# Patient Record
Sex: Female | Born: 1955
Health system: Southern US, Community
[De-identification: ages and names within clinical notes are randomized; demographics above are authoritative.]

## PROBLEM LIST (undated history)

## (undated) DIAGNOSIS — K5900 Constipation, unspecified: Secondary | ICD-10-CM

## (undated) DIAGNOSIS — K219 Gastro-esophageal reflux disease without esophagitis: Secondary | ICD-10-CM

## (undated) DIAGNOSIS — M5430 Sciatica, unspecified side: Secondary | ICD-10-CM

## (undated) DIAGNOSIS — I10 Essential (primary) hypertension: Secondary | ICD-10-CM

## (undated) HISTORY — PX: ROTATOR CUFF REPAIR: SHX139

---

## 2019-02-17 ENCOUNTER — Other Ambulatory Visit: Payer: Self-pay

## 2019-02-17 ENCOUNTER — Encounter (HOSPITAL_COMMUNITY): Payer: Self-pay | Admitting: Emergency Medicine

## 2019-02-17 ENCOUNTER — Ambulatory Visit (INDEPENDENT_AMBULATORY_CARE_PROVIDER_SITE_OTHER): Payer: PRIVATE HEALTH INSURANCE

## 2019-02-17 ENCOUNTER — Ambulatory Visit (HOSPITAL_COMMUNITY)
Admission: EM | Admit: 2019-02-17 | Discharge: 2019-02-17 | Disposition: A | Discharge status: Home / Self Care | Payer: PRIVATE HEALTH INSURANCE | Attending: Family Medicine | Admitting: Family Medicine

## 2019-02-17 DIAGNOSIS — M25562 Pain in left knee: Secondary | ICD-10-CM | POA: Diagnosis not present

## 2019-02-17 DIAGNOSIS — M1712 Unilateral primary osteoarthritis, left knee: Secondary | ICD-10-CM

## 2019-02-17 HISTORY — DX: Essential (primary) hypertension: I10

## 2019-02-17 NOTE — ED Triage Notes (Signed)
PT has left knee pain that started as she was bending to use the toilet. She felt a pop and a sharp pain and left knee "gave out."

## 2019-02-17 NOTE — Discharge Instructions (Addendum)
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 Follow-up with orthopedist when she gets back to her home in [place].

## 2019-02-17 NOTE — ED Provider Notes (Signed)
[place]    CSN: [id number] Arrival date & time: 02/17/19  1743      History   Chief Complaint Chief Complaint  Patient presents with  . Knee Pain    HPI Samantha Bright is a 63 y.o. female.   Patient was sitting on top of her today and felt a pop behind her left knee.  8 years ago she had arthroscopic surgery and was told then she had some degenerative changes.  She does complain that her knee makes noises with flexion extension and climbing steps.  There is no history of the knee locking or giving way until today.  HPI  Past Medical History:  Diagnosis Date  . Hypertension     There are no problems to display for this patient.   History reviewed. No pertinent surgical history.  OB History   No obstetric history on file.      Home Medications    Prior to Admission medications   Medication Sig Start Date End Date Taking? Authorizing Provider  cetirizine (ZYRTEC) 10 MG chewable tablet Chew 10 mg by mouth daily.   Yes [provider]  famotidine (PEPCID) 40 MG tablet Take 40 mg by mouth daily.   Yes [provider]  fluticasone (FLONASE) 50 MCG/ACT nasal spray Place into both nostrils daily.   Yes [provider]  nebivolol (BYSTOLIC) 10 MG tablet Take 10 mg by mouth daily.   Yes [provider]    Family History No family history on file.  Social History Social History   Tobacco Use  . Smoking status: Never Smoker  . Smokeless tobacco: Never Used  Substance Use Topics  . Alcohol use: Not on file  . Drug use: Not on file     Allergies   Patient has no allergy information on record.   Review of Systems Review of Systems  Musculoskeletal: Positive for arthralgias and joint swelling.  All other systems reviewed and are negative.    Physical Exam Triage Vital Signs ED Triage Vitals  Enc Vitals Group     BP 02/17/19 1837 (!) 150/76     Pulse Rate 02/17/19 1836 (!) 58     Resp 02/17/19 1836 16     Temp 02/17/19 1836 99.1 F (37.3 C)     Temp Source 02/17/19 1836 Oral     SpO2 02/17/19 1836 100 %     Weight --      Height --      Head Circumference --      Peak Flow --      Pain Score 02/17/19 1834 5     Pain Loc --      Pain Edu? --      Excl. in [place]? --    No data found.  Updated Vital Signs BP (!) 150/76   Pulse (!) 58   Temp 99.1 F (37.3 C) (Oral)   Resp 16   SpO2 100%   Visual Acuity Right Eye Distance:   Left Eye Distance:   Bilateral Distance:    Right Eye Near:   Left Eye Near:    Bilateral Near:     Physical Exam Constitutional:      Appearance: Normal appearance.  Musculoskeletal:     Comments: Left knee: There is a small effusion noted.  There is crepitance with flexion extension.  There is medial joint line tenderness although I cannot detect any instability. There is also tenderness posteriorly which raises suggestion possibility of plantaris rupture  or tear given her history.  Neurological:     Mental Status: She is alert.      UC Treatments / Results  Labs (all labs ordered are listed, but only abnormal results are displayed) Labs Reviewed - No data to display  EKG   Radiology No results found.  Procedures Procedures (including critical care time)  Medications Ordered in UC Medications - No data to display  Initial Impression / Assessment and Plan / UC Course  I have reviewed the triage vital signs and the nursing notes.  Pertinent labs & imaging results that were available during my care of the patient were reviewed by me and considered in my medical decision making (see chart for details).     Left knee pain.  Would favor degenerative tear of medial meniscus, but also consider plantaris rupture.  I have fitted her in a knee brace which she will wear until she sees her orthopedist in [place]. Final Clinical Impressions(s) / UC Diagnoses   Final diagnoses:  None   Discharge Instructions   None    ED Prescriptions      None     PDMP not reviewed this encounter.   Wardell Honour, MD 02/17/19 Des[place] Pole

## 2019-03-23 ENCOUNTER — Ambulatory Visit (HOSPITAL_COMMUNITY)
Admission: EM | Admit: 2019-03-23 | Discharge: 2019-03-23 | Disposition: A | Discharge status: Home / Self Care | Payer: Medicare Other | Attending: Family Medicine | Admitting: Family Medicine

## 2019-03-23 ENCOUNTER — Other Ambulatory Visit: Payer: Self-pay

## 2019-03-23 ENCOUNTER — Encounter (HOSPITAL_COMMUNITY): Payer: Self-pay

## 2019-03-23 DIAGNOSIS — Z8616 Personal history of COVID-19: Secondary | ICD-10-CM

## 2019-03-23 NOTE — ED Triage Notes (Signed)
Pt states COVID test positive on Jan 12 and denies any current sx. Needs clearance to fly back to [place].

## 2019-03-23 NOTE — ED Provider Notes (Signed)
[place]    CSN: [id number] Arrival date & time: 03/23/19  [place]      History   Chief Complaint Chief Complaint  Patient presents with  . covid clearance    HPI Samantha Bright is a 64 y.o. female.   Samantha Bright presents with requests for note or testing/ clearance to fly to [place] state s/p covid-19 infection. She tested positive 1/12 after her daughter tested positive, they live together. Her symptoms have since resolved. She feels well. No further cough. No fevers. She is scheduled to fly 2/7. State requires testing to arrive. She lives in [place] and had been here visiting.     ROS per HPI, negative if not otherwise mentioned.      Past Medical History:  Diagnosis Date  . Hypertension     There are no problems to display for this patient.   History reviewed. No pertinent surgical history.  OB History   No obstetric history on file.      Home Medications    Prior to Admission medications   Medication Sig Start Date End Date Taking? Authorizing Provider  cetirizine (ZYRTEC) 10 MG chewable tablet Chew 10 mg by mouth daily.    [provider]  famotidine (PEPCID) 40 MG tablet Take 40 mg by mouth daily.    [provider]  fluticasone (FLONASE) 50 MCG/ACT nasal spray Place into both nostrils daily.    [provider]  nebivolol (BYSTOLIC) 10 MG tablet Take 10 mg by mouth daily.    [provider]    Family History Family History  Family history unknown: Yes    Social History Social History   Tobacco Use  . Smoking status: Never Smoker  . Smokeless tobacco: Never Used  Substance Use Topics  . Alcohol use: Not on file  . Drug use: Not on file     Allergies   Penicillins   Review of Systems Review of Systems   Physical Exam Triage Vital Signs ED Triage Vitals  Enc Vitals Group     BP 03/23/19 1910 (!) 191/87     Pulse Rate 03/23/19 1910 70     Resp 03/23/19 1910 16     Temp  03/23/19 1910 98.5 F (36.9 C)     Temp Source 03/23/19 1910 Oral     SpO2 03/23/19 1910 100 %     Weight --      Height --      Head Circumference --      Peak Flow --      Pain Score 03/23/19 1930 0     Pain Loc --      Pain Edu? --      Excl. in [place]? --    No data found.  Updated Vital Signs BP (!) 191/87 (BP Location: Right Arm)   Pulse 70   Temp 98.5 F (36.9 C) (Oral)   Resp 16   SpO2 100%   Physical Exam Constitutional:      General: She is not in acute distress.    Appearance: She is well-developed.  Cardiovascular:     Rate and Rhythm: Normal rate.  Pulmonary:     Effort: Pulmonary effort is normal.  Skin:    General: Skin is warm and dry.  Neurological:     Mental Status: She is alert and oriented to person, place, and time.      UC Treatments / Results  Labs (all labs ordered are listed, but only  abnormal results are displayed) Labs Reviewed - No data to display  EKG   Radiology No results found.  Procedures Procedures (including critical care time)  Medications Ordered in UC Medications - No data to display  Initial Impression / Assessment and Plan / UC Course  I have reviewed the triage vital signs and the nursing notes.  Pertinent labs & imaging results that were available during my care of the patient were reviewed by me and considered in my medical decision making (see chart for details).     covid 19 infection recent, tested positive 1/12. No further symptoms. Per cdc guideline ok to end isolation. Note provided stating such, and ok to fly. Not recommended to retest at this time. Patient verbalized understanding and agreeable to plan.   Final Clinical Impressions(s) / UC Diagnoses   Final diagnoses:  History of COVID-19     Discharge Instructions     Per CDC guidelines ok to end isolation 10 days after onset of symptoms as long as symptoms have improved.    ED Prescriptions    None     PDMP not reviewed this encounter.    Zigmund Gottron, NP 03/23/19 1941

## 2019-03-23 NOTE — Discharge Instructions (Addendum)
Per CDC guidelines ok to end isolation 10 days after onset of symptoms as long as symptoms have improved.

## 2020-01-28 ENCOUNTER — Other Ambulatory Visit: Payer: Self-pay | Admitting: *Deleted

## 2020-01-28 DIAGNOSIS — R5381 Other malaise: Secondary | ICD-10-CM

## 2020-03-11 ENCOUNTER — Other Ambulatory Visit: Payer: Self-pay

## 2020-03-11 ENCOUNTER — Emergency Department (HOSPITAL_BASED_OUTPATIENT_CLINIC_OR_DEPARTMENT_OTHER)
Admission: EM | Admit: 2020-03-11 | Discharge: 2020-03-12 | Disposition: A | Discharge status: Home / Self Care | Payer: Medicare Other | Attending: Emergency Medicine | Admitting: Emergency Medicine

## 2020-03-11 ENCOUNTER — Encounter (HOSPITAL_BASED_OUTPATIENT_CLINIC_OR_DEPARTMENT_OTHER): Payer: Self-pay | Admitting: *Deleted

## 2020-03-11 DIAGNOSIS — K219 Gastro-esophageal reflux disease without esophagitis: Secondary | ICD-10-CM | POA: Insufficient documentation

## 2020-03-11 DIAGNOSIS — R109 Unspecified abdominal pain: Secondary | ICD-10-CM | POA: Insufficient documentation

## 2020-03-11 DIAGNOSIS — Z79899 Other long term (current) drug therapy: Secondary | ICD-10-CM | POA: Diagnosis not present

## 2020-03-11 DIAGNOSIS — I10 Essential (primary) hypertension: Secondary | ICD-10-CM | POA: Diagnosis not present

## 2020-03-11 HISTORY — DX: Constipation, unspecified: K59.00

## 2020-03-11 HISTORY — DX: Sciatica, unspecified side: M54.30

## 2020-03-11 HISTORY — DX: Gastro-esophageal reflux disease without esophagitis: K21.9

## 2020-03-11 MED ORDER — ONDANSETRON HCL 4 MG/2ML IJ SOLN
4.0000 mg | Freq: Once | INTRAMUSCULAR | Status: AC
  Administered 2020-03-11: 4 mg via INTRAVENOUS
  Filled 2020-03-11: qty 2

## 2020-03-11 MED ORDER — FENTANYL CITRATE (PF) 100 MCG/2ML IJ SOLN
100.0000 ug | Freq: Once | INTRAMUSCULAR | Status: AC
  Administered 2020-03-11: 100 ug via INTRAVENOUS
  Filled 2020-03-11: qty 2

## 2020-03-11 NOTE — ED Triage Notes (Addendum)
Left flank pain since this am. States she has been constipated.

## 2020-03-11 NOTE — ED Notes (Signed)
Left flank pain x 1 day. Patient has hx of kidney stones and constipation. Patient states good BM today.

## 2020-03-11 NOTE — ED Provider Notes (Signed)
[facility] Provider Note: Georgena Spurling, MD, FACEP  CSN: [id number] MRN: [id number] ARRIVAL: 03/11/20 at [place]: [place]  Flank Pain   HISTORY OF PRESENT ILLNESS  03/11/20 11:01 PM Samantha Bright is a 65 y.o. female who developed left flank pain during sleep yesterday evening.  On awakening this morning the pain was severe (10 out of 10), worse with movement.  She applied a heating pad and this improve the pain and up that she was able to get out of bed.  The pain is somewhat like previous sciatica but in a different location.  She is not aware of any injury or inciting event.  She has had no hematuria or dysuria.  She has had no nausea or vomiting with this.  She does have chronic constipation for which she takes MiraLAX.    Past Medical History:  Diagnosis Date  . Constipation   . GERD (gastroesophageal reflux disease)   . Hypertension   . Sciatica     Past Surgical History:  Procedure Laterality Date  . ROTATOR CUFF REPAIR      Family History  Family history unknown: Yes    Social History   Tobacco Use  . Smoking status: Never Smoker  . Smokeless tobacco: Never Used  Substance Use Topics  . Alcohol use: Not Currently  . Drug use: Never    Prior to Admission medications   Medication Sig Start Date End Date Taking? Authorizing Provider  cetirizine (ZYRTEC) 10 MG chewable tablet Chew 10 mg by mouth daily.   Yes [provider]  famotidine (PEPCID) 40 MG tablet Take 40 mg by mouth daily.   Yes [provider]  fluticasone (FLONASE) 50 MCG/ACT nasal spray Place into both nostrils daily.   Yes [provider]  LORazepam (ATIVAN) 1 MG tablet Take 1 tablet (1 mg total) by mouth every 8 (eight) hours as needed (muscle spasms). 03/12/20  Yes Kennadie Brenner, MD  nebivolol (BYSTOLIC) 10 MG tablet Take 10 mg by mouth daily.   Yes [provider]    Allergies Penicillins   REVIEW OF SYSTEMS   Negative except as noted here or in the History of Present Illness.   PHYSICAL EXAMINATION  Initial Vital Signs Blood pressure (!) 157/81, pulse 66, temperature 97.8 F (36.6 C), temperature source Oral, resp. rate 20, height 5' 7.5" (1.715 m), weight 72.6 kg, SpO2 100 %.  Examination General: Well-developed, well-nourished female in no acute distress; appearance consistent with age of record HENT: normocephalic; atraumatic Eyes: pupils equal, round and reactive to light; extraocular muscles intact; arcus senilis bilaterally Neck: supple Heart: regular rate and rhythm Lungs: clear to auscultation bilaterally Abdomen: soft; nondistended; nontender; bowel sounds present Back/GU: Left CVA tenderness, no rash Extremities: No deformity; full range of motion; trace edema of lower legs Neurologic: Awake, alert and oriented; motor function intact in all extremities and symmetric; no facial droop Skin: Warm and dry Psychiatric: Normal mood and affect   RESULTS  Summary of this visit's results, reviewed and interpreted by myself:   EKG Interpretation  Date/Time:    Ventricular Rate:    PR Interval:    QRS Duration:   QT Interval:    QTC Calculation:   R Axis:     Text Interpretation:        Laboratory Studies: Results for orders placed or performed during the hospital encounter of 03/11/20 (from the past 24 hour(s))  Urinalysis, Routine w reflex microscopic Urine, Clean Catch  Status: None   Collection Time: 03/12/20 12:04 AM  Result Value Ref Range   Color, Urine YELLOW YELLOW   APPearance CLEAR CLEAR   Specific Gravity, Urine 1.005 1.005 - 1.030   pH 6.0 5.0 - 8.0   Glucose, UA NEGATIVE NEGATIVE mg/dL   Hgb urine dipstick NEGATIVE NEGATIVE   Bilirubin Urine NEGATIVE NEGATIVE   Ketones, ur NEGATIVE NEGATIVE mg/dL   Protein, ur NEGATIVE NEGATIVE mg/dL   Nitrite NEGATIVE NEGATIVE   Leukocytes,Ua NEGATIVE NEGATIVE   Imaging Studies: No results found.  ED COURSE and  MDM  Nursing notes, initial and subsequent vitals signs, including pulse oximetry, reviewed and interpreted by myself.  Vitals:   03/11/20 2214 03/11/20 2216 03/11/20 2226  BP:  (!) 155/95 (!) 157/81  Pulse:  80 66  Resp:  20 20  Temp:  97.8 F (36.6 C)   TempSrc:  Oral   SpO2:  100% 100%  Weight: 72.6 kg    Height: 5' 7.5" (1.715 m)     Medications  ondansetron (ZOFRAN) injection 4 mg (4 mg Intravenous Given 03/11/20 2353)  fentaNYL (SUBLIMAZE) injection 100 mcg (100 mcg Intravenous Given 03/11/20 2354)   The patient's urinalysis is normal.  I see no rash to suggest shingles and she does not have allodynia/hyperesthesia which is often seen with shingles.  I suspect her pain is musculoskeletal in nature.  She had a similar pain in November and was treated with lorazepam which she states worked very well.  This would suggest her pain is due to muscle spasms and she would like to proceed with Ativan again.   PROCEDURES  Procedures   ED DIAGNOSES     ICD-10-CM   1. Left flank pain  R10.9        Lilith Solana, Jenny Reichmann, MD 03/12/20 [phone]

## 2020-03-12 LAB — URINALYSIS, ROUTINE W REFLEX MICROSCOPIC
Bilirubin Urine: NEGATIVE
Glucose, UA: NEGATIVE mg/dL
Hgb urine dipstick: NEGATIVE
Ketones, ur: NEGATIVE mg/dL
Leukocytes,Ua: NEGATIVE
Nitrite: NEGATIVE
Protein, ur: NEGATIVE mg/dL
Specific Gravity, Urine: 1.005 (ref 1.005–1.030)
pH: 6 (ref 5.0–8.0)

## 2020-03-12 MED ORDER — LORAZEPAM 1 MG PO TABS: 1.0000 mg | ORAL_TABLET | Freq: Three times a day (TID) | ORAL | 0 refills | Status: DC | PRN

## 2020-03-19 ENCOUNTER — Emergency Department (HOSPITAL_COMMUNITY): Payer: Medicare Other

## 2020-03-19 ENCOUNTER — Encounter (HOSPITAL_COMMUNITY): Payer: Self-pay

## 2020-03-19 ENCOUNTER — Emergency Department (HOSPITAL_COMMUNITY)
Admission: EM | Admit: 2020-03-19 | Discharge: 2020-03-19 | Disposition: A | Discharge status: Home / Self Care | Payer: Medicare Other | Attending: Emergency Medicine | Admitting: Emergency Medicine

## 2020-03-19 DIAGNOSIS — Z79899 Other long term (current) drug therapy: Secondary | ICD-10-CM | POA: Insufficient documentation

## 2020-03-19 DIAGNOSIS — I10 Essential (primary) hypertension: Secondary | ICD-10-CM | POA: Insufficient documentation

## 2020-03-19 DIAGNOSIS — R58 Hemorrhage, not elsewhere classified: Secondary | ICD-10-CM | POA: Diagnosis not present

## 2020-03-19 DIAGNOSIS — S6992XA Unspecified injury of left wrist, hand and finger(s), initial encounter: Secondary | ICD-10-CM | POA: Diagnosis not present

## 2020-03-19 DIAGNOSIS — W19XXXA Unspecified fall, initial encounter: Secondary | ICD-10-CM | POA: Diagnosis not present

## 2020-03-19 DIAGNOSIS — S8992XA Unspecified injury of left lower leg, initial encounter: Secondary | ICD-10-CM | POA: Diagnosis not present

## 2020-03-19 DIAGNOSIS — S0990XA Unspecified injury of head, initial encounter: Secondary | ICD-10-CM | POA: Diagnosis not present

## 2020-03-19 DIAGNOSIS — Z743 Need for continuous supervision: Secondary | ICD-10-CM | POA: Diagnosis not present

## 2020-03-19 DIAGNOSIS — S060X0A Concussion without loss of consciousness, initial encounter: Secondary | ICD-10-CM | POA: Diagnosis not present

## 2020-03-19 DIAGNOSIS — R52 Pain, unspecified: Secondary | ICD-10-CM | POA: Diagnosis not present

## 2020-03-19 DIAGNOSIS — R519 Headache, unspecified: Secondary | ICD-10-CM | POA: Diagnosis not present

## 2020-03-19 DIAGNOSIS — W000XXA Fall on same level due to ice and snow, initial encounter: Secondary | ICD-10-CM | POA: Diagnosis not present

## 2020-03-19 DIAGNOSIS — S0083XA Contusion of other part of head, initial encounter: Secondary | ICD-10-CM | POA: Diagnosis not present

## 2020-03-19 DIAGNOSIS — M25562 Pain in left knee: Secondary | ICD-10-CM | POA: Diagnosis not present

## 2020-03-19 DIAGNOSIS — M79642 Pain in left hand: Secondary | ICD-10-CM | POA: Diagnosis not present

## 2020-03-19 LAB — CBC WITH DIFFERENTIAL/PLATELET
Abs Immature Granulocytes: 0.02 10*3/uL (ref 0.00–0.07)
Basophils Absolute: 0 10*3/uL (ref 0.0–0.1)
Basophils Relative: 0 %
Eosinophils Absolute: 0.3 10*3/uL (ref 0.0–0.5)
Eosinophils Relative: 4 %
HCT: 42.3 % (ref 36.0–46.0)
Hemoglobin: 13.6 g/dL (ref 12.0–15.0)
Immature Granulocytes: 0 %
Lymphocytes Relative: 34 %
Lymphs Abs: 2.7 10*3/uL (ref 0.7–4.0)
MCH: 31.1 pg (ref 26.0–34.0)
MCHC: 32.2 g/dL (ref 30.0–36.0)
MCV: 96.8 fL (ref 80.0–100.0)
Monocytes Absolute: 0.5 10*3/uL (ref 0.1–1.0)
Monocytes Relative: 6 %
Neutro Abs: 4.5 10*3/uL (ref 1.7–7.7)
Neutrophils Relative %: 56 %
Platelets: 226 10*3/uL (ref 150–400)
RBC: 4.37 MIL/uL (ref 3.87–5.11)
RDW: 11.5 % (ref 11.5–15.5)
WBC: 8 10*3/uL (ref 4.0–10.5)
nRBC: 0 % (ref 0.0–0.2)

## 2020-03-19 LAB — COMPREHENSIVE METABOLIC PANEL
ALT: 16 U/L (ref 0–44)
AST: 24 U/L (ref 15–41)
Albumin: 3.6 g/dL (ref 3.5–5.0)
Alkaline Phosphatase: 115 U/L (ref 38–126)
Anion gap: 10 (ref 5–15)
BUN: 14 mg/dL (ref 8–23)
CO2: 27 mmol/L (ref 22–32)
Calcium: 9.3 mg/dL (ref 8.9–10.3)
Chloride: 106 mmol/L (ref 98–111)
Creatinine, Ser: 0.58 mg/dL (ref 0.44–1.00)
GFR, Estimated: >60 mL/min (ref >60)
Glucose, Bld: 131 mg/dL — ABNORMAL HIGH (ref 70–99)
Potassium: 4 mmol/L (ref 3.5–5.1)
Sodium: 143 mmol/L (ref 135–145)
Total Bilirubin: 0.4 mg/dL (ref 0.3–1.2)
Total Protein: 6.7 g/dL (ref 6.5–8.1)

## 2020-03-19 MED ORDER — NAPROXEN 500 MG PO TABS: 500.0000 mg | ORAL_TABLET | Freq: Two times a day (BID) | ORAL | 0 refills | Status: DC

## 2020-03-19 MED ORDER — HYDROCODONE-ACETAMINOPHEN 5-325 MG PO TABS
1.0000 | ORAL_TABLET | Freq: Once | ORAL | Status: AC
  Administered 2020-03-19: 1 via ORAL
  Filled 2020-03-19: qty 1

## 2020-03-19 NOTE — ED Provider Notes (Signed)
[facility] EMERGENCY DEPARTMENT Provider Note   CSN: [id number] Arrival date & time: 03/19/20  1850     History Chief Complaint  Patient presents with  . Fall    Samantha Bright is a 65 y.o. female.  HPI   This patient is a 65 year old female, she has a history of acid reflux as well as hypertension and sciatica.  She has had a prior rotator cuff repair of her left shoulder.  She was in her usual state of health when she was walking outside of a store, she slipped on the ice and fell as she tried to step up onto a curb, she fell onto her left shoulder left hand and bumped the left side of her face and scalp, just above the left eye.  She had no loss of consciousness, she was able to get up off the ground, the paramedics found her with a large hematoma over the left eye as well as a small laceration to the side of the scalp.  Bleeding was controlled, no seizures, no vomiting, no nausea, no repetitive questioning.  The patient denies any chest pain or shortness of breath.  She does report like her balance has been off for a few weeks, she is not very detailed with timing but states that she thinks that that is part of the reason that she fell today is that her balance is not quite right.  She was seen at an emergency department approximately 1 week ago for flank pain, she was prescribed Ativan for muscle spasms  According to the medical record the patient currently takes a blood pressure medication, Bystolic, Pepcid, Flonase and the Ativan as needed.  Past Medical History:  Diagnosis Date  . Constipation   . GERD (gastroesophageal reflux disease)   . Hypertension   . Sciatica     There are no problems to display for this patient.   Past Surgical History:  Procedure Laterality Date  . ROTATOR CUFF REPAIR       OB History   No obstetric history on file.     Family History  Family history unknown: Yes    Social History   Tobacco Use  . Smoking  status: Never Smoker  . Smokeless tobacco: Never Used  Substance Use Topics  . Alcohol use: Not Currently  . Drug use: Never    Home Medications Prior to Admission medications   Medication Sig Start Date End Date Taking? Authorizing Provider  naproxen (NAPROSYN) 500 MG tablet Take 1 tablet (500 mg total) by mouth 2 (two) times daily with a meal. 03/19/20  Yes Noemi Chapel, MD  cetirizine (ZYRTEC) 10 MG chewable tablet Chew 10 mg by mouth daily.    [provider]  famotidine (PEPCID) 40 MG tablet Take 40 mg by mouth daily.    [provider]  fluticasone (FLONASE) 50 MCG/ACT nasal spray Place into both nostrils daily.    [provider]  LORazepam (ATIVAN) 1 MG tablet Take 1 tablet (1 mg total) by mouth every 8 (eight) hours as needed (muscle spasms). 03/12/20   Molpus, John, MD  nebivolol (BYSTOLIC) 10 MG tablet Take 10 mg by mouth daily.    [provider]    Allergies    Penicillins  Review of Systems   Review of Systems  All other systems reviewed and are negative.   Physical Exam Updated Vital Signs BP (!) 155/78   Pulse 60   Temp 98 F (36.7 C) (Oral)  Resp 20   SpO2 100%   Physical Exam Vitals and nursing note reviewed.  Constitutional:      General: She is not in acute distress.    Appearance: She is well-developed and well-nourished.  HENT:     Head: Normocephalic.     Comments: 3 cm hematoma overlying the left orbit around the eyebrow, no laceration.  Small amount of tenderness to the left scalp but no significant hematoma or laceration visualized, no malocclusion    Mouth/Throat:     Mouth: Oropharynx is clear and moist.     Pharynx: No oropharyngeal exudate.  Eyes:     General: No scleral icterus.       Right eye: No discharge.        Left eye: No discharge.     Extraocular Movements: EOM normal.     Conjunctiva/sclera: Conjunctivae normal.     Pupils: Pupils are equal, round, and reactive to light.  Neck:      Thyroid: No thyromegaly.     Vascular: No JVD.  Cardiovascular:     Rate and Rhythm: Normal rate and regular rhythm.     Pulses: Intact distal pulses.     Heart sounds: Normal heart sounds. No murmur heard. No friction rub. No gallop.   Pulmonary:     Effort: Pulmonary effort is normal. No respiratory distress.     Breath sounds: Normal breath sounds. No wheezing or rales.  Abdominal:     General: Bowel sounds are normal. There is no distension.     Palpations: Abdomen is soft. There is no mass.     Tenderness: There is no abdominal tenderness.  Musculoskeletal:        General: Tenderness present. No edema. Normal range of motion.     Cervical back: Normal range of motion and neck supple.     Comments: Tenderness to palpation of the left hand with range of motion, also the left knee has some crepitance with range of motion, the patient states she has chronic arthritis  Lymphadenopathy:     Cervical: No cervical adenopathy.  Skin:    General: Skin is warm and dry.     Findings: No erythema or rash.  Neurological:     Mental Status: She is alert.     Coordination: Coordination normal.     Comments: The patient has normal speech coordination and memory.  She is able to do everything that I ask her to do  Psychiatric:        Mood and Affect: Mood and affect normal.        Behavior: Behavior normal.     ED Results / Procedures / Treatments   Labs (all labs ordered are listed, but only abnormal results are displayed) Labs Reviewed  COMPREHENSIVE METABOLIC PANEL - Abnormal; Notable for the following components:      Result Value   Glucose, Bld 131 (*)    All other components within normal limits  CBC WITH DIFFERENTIAL/PLATELET    EKG None  Radiology CT Head Wo Contrast  Result Date: 03/19/2020 CLINICAL DATA:  Facial trauma with pain, initial encounter EXAM: CT HEAD WITHOUT CONTRAST TECHNIQUE: Contiguous axial images were obtained from the base of the skull through the  vertex without intravenous contrast. COMPARISON:  None. FINDINGS: Brain: No evidence of acute infarction, hemorrhage, hydrocephalus, extra-axial collection or mass lesion/mass effect. Vascular: No hyperdense vessel or unexpected calcification. Skull: Normal. Negative for fracture or focal lesion. Sinuses/Orbits: Orbits and their contents are within normal  limits. Other: Small laceration is noted in the left supraorbital region with a small subcutaneous hematoma consistent with the recent injury. No other soft tissue abnormality is noted. IMPRESSION: Soft tissue changes in the left supraorbital region consistent with the given clinical history. No acute intracranial abnormality is seen. Electronically Signed   By: Inez Catalina M.D.   On: 03/19/2020 19:27   DG Knee Complete 4 Views Left  Result Date: 03/19/2020 CLINICAL DATA:  Acute pain due to trauma EXAM: LEFT KNEE - COMPLETE 4+ VIEW COMPARISON:  February 17, 2019 FINDINGS: There are moderate tricompartmental degenerative changes of the knee. There is a large intra-articular loose body in the posterior joint space. There is no acute displaced fracture or dislocation. No significant joint effusion. IMPRESSION: Moderate tricompartmental degenerative changes with a large intra-articular loose body in the posterior joint space. Electronically Signed   By: Constance Holster M.D.   On: 03/19/2020 19:46   DG Hand Complete Left  Result Date: 03/19/2020 CLINICAL DATA:  Acute pain due to trauma EXAM: LEFT HAND - COMPLETE 3+ VIEW COMPARISON:  None. FINDINGS: There is no evidence of fracture or dislocation. There is no evidence of arthropathy or other focal bone abnormality. Soft tissues are unremarkable. IMPRESSION: Negative. Electronically Signed   By: Constance Holster M.D.   On: 03/19/2020 19:45    Procedures Procedures   Medications Ordered in ED Medications  HYDROcodone-acetaminophen (NORCO/VICODIN) 5-325 MG per tablet 1 tablet (1 tablet Oral Given  03/19/20 2026)    ED Course  I have reviewed the triage vital signs and the nursing notes.  Pertinent labs & imaging results that were available during my care of the patient were reviewed by me and considered in my medical decision making (see chart for details).    MDM Rules/Calculators/A&P                          Seems like a mechanical fall, she has been taking Ativan as needed, I wonder if this is something to do with her feeling off balance.  At this time she does not have any focal neurologic deficits.  We will do a CT scan of the head to make sure there is no intracranial hematoma, she has no other signs of fracture of the face, there is no active bleeding, she states that she would like to have some lab work done which I think is reasonable given her feeling off balance.  He is followed by [facility] family practice at [facility] and has an appointment this week  Labs and CT scan are unremarkable, the patient is well-appearing, she does not have an identifiable laceration when the wounds were explored.  At this time the patient is stable, she has a family member with her here, I have suspect that she has had a mild concussion.  She has been given pain medication feels better, vital signs are unremarkable except for minimal hypertension.  Instructions given regarding concussions.  She has a follow-up with her doctor on Wednesday  Final Clinical Impression(s) / ED Diagnoses Final diagnoses:  Facial hematoma, initial encounter  Concussion without loss of consciousness, initial encounter    Rx / DC Orders ED Discharge Orders         Ordered    naproxen (NAPROSYN) 500 MG tablet  2 times daily with meals        03/19/20 2229           Noemi Chapel, MD 03/19/20 2231

## 2020-03-19 NOTE — ED Triage Notes (Signed)
Patient brought in by ems with c/o slip and fall. No loc, no blood thinners. Hematoma to left eye and laceration to left side of head. C/o pain also to left hand with swelling and left knee pain. Alert and oriented.

## 2020-03-19 NOTE — ED Notes (Signed)
Discharge instructions provided to patient. Verbalized understanding. Alert and oriented. Escorted out of ED via w/c. °

## 2020-03-19 NOTE — Discharge Instructions (Signed)
Your CT scan looks good - no internal injuries, Blood work looks good - xrays normal Naprosyn for pain, ice for swelling See your doctor in 3 days for recheck

## 2020-03-23 DIAGNOSIS — R1084 Generalized abdominal pain: Secondary | ICD-10-CM | POA: Diagnosis not present

## 2020-03-23 DIAGNOSIS — R29818 Other symptoms and signs involving the nervous system: Secondary | ICD-10-CM | POA: Diagnosis not present

## 2020-03-25 ENCOUNTER — Encounter: Payer: Self-pay | Admitting: Neurology

## 2020-03-29 DIAGNOSIS — I10 Essential (primary) hypertension: Secondary | ICD-10-CM | POA: Diagnosis not present

## 2020-03-29 DIAGNOSIS — S060X0D Concussion without loss of consciousness, subsequent encounter: Secondary | ICD-10-CM | POA: Diagnosis not present

## 2020-04-04 DIAGNOSIS — H2513 Age-related nuclear cataract, bilateral: Secondary | ICD-10-CM | POA: Diagnosis not present

## 2020-04-04 DIAGNOSIS — R6889 Other general symptoms and signs: Secondary | ICD-10-CM | POA: Diagnosis not present

## 2020-04-04 DIAGNOSIS — H524 Presbyopia: Secondary | ICD-10-CM | POA: Diagnosis not present

## 2020-04-05 DIAGNOSIS — K219 Gastro-esophageal reflux disease without esophagitis: Secondary | ICD-10-CM | POA: Diagnosis not present

## 2020-04-05 DIAGNOSIS — K5900 Constipation, unspecified: Secondary | ICD-10-CM | POA: Diagnosis not present

## 2020-04-27 NOTE — Progress Notes (Signed)
NEUROLOGY CONSULTATION NOTE  Samantha Bright [id number]   Referring provider:  Sela Hilding, MD Primary care provider:  Sela Hilding, MD  Assessment/Plan:   1.  Falls - I think it is related to her osteoarthritis. 2.  Memory problems   1.  Check B12 and TSH 2.  Neuropsychological testing 3.  Further recommendations pending results.  Subjective:  Samantha Bright is a 65 year old right-handed female who presents for balance difficulty.  History supplemented by ED and referring provider's notes.  02-Jun-2017 christmas missed last stair - left rotator cuff tear 2019-06-03 going up a couple of steps in the garage and fell on "disconnect"    walkin outside rubber boot and flew her rig caught on curb lives in a complex apartment alone second floor   Connecting for things - church work - volunteers not doing as much but forgetting things walk into room and misplace keys     She reports 3 major falls over the past 3 years.  During Christmas 2019, she missed the last stair and fell, tearing her left rotator cuff.  In 2019-06-03, she was walking up a couple of steps in the garage and fell.  She was seen in the ED on 03/19/2020 after she tripped on the curb (her left foot couldn't clear the curb) and fell on her left side of head, face, shoulder and hand.  No loss of consciousness.  She sustained a large hematoma over the left eye and small laceration to side of scalp.  CT head personally reviewed showed no acute intracranial abnormality.  She has history of sciatica.  She also has known ostearthritis with loose bodies involving the left knee.  She is concerned of a neurologic etiology.  She is also concerned about her memory.  She says she often misplaces objects.  She may walk into a room and forget what she wanted to do.  She moved down here last year to live near her daughter after her husband passed away in Jun 03, 2018.  No family history of dementia.    PAST MEDICAL HISTORY: Past Medical History:   Diagnosis Date  . Constipation   . GERD (gastroesophageal reflux disease)   . Hypertension   . Sciatica     MEDICATIONS: Current Outpatient Medications on File Prior to Visit  Medication Sig Dispense Refill  . cetirizine (ZYRTEC) 10 MG chewable tablet Chew 10 mg by mouth daily.    . famotidine (PEPCID) 40 MG tablet Take 40 mg by mouth daily.    . fluticasone (FLONASE) 50 MCG/ACT nasal spray Place into both nostrils daily.    Marland Kitchen LORazepam (ATIVAN) 1 MG tablet Take 1 tablet (1 mg total) by mouth every 8 (eight) hours as needed (muscle spasms). 15 tablet 0  . naproxen (NAPROSYN) 500 MG tablet Take 1 tablet (500 mg total) by mouth 2 (two) times daily with a meal. 30 tablet 0  . nebivolol (BYSTOLIC) 10 MG tablet Take 10 mg by mouth daily.     No current facility-administered medications on file prior to visit.    ALLERGIES: Allergies  Allergen Reactions  . Penicillins     Hives Hives     FAMILY HISTORY: Family History  Family history unknown: Yes      Objective:  Blood pressure (!) 139/92, pulse 89, height 5\' 8"  (1.727 m), weight 164 lb 9.6 oz (74.7 kg), SpO2 97 %. General: No acute distress.  Patient appears well-groomed.   Head:  Normocephalic/atraumatic Eyes:  Fundi examined but not visualized  Neck: supple, no paraspinal tenderness, full range of motion Heart:  Regular rate and rhythm Lungs:  Clear to auscultation bilaterally Back: No paraspinal tenderness Neurological Exam:  [employer] Mental Exam 04/28/2020  Weekday Correct 1  Current year 1  What state are we in? 1  Amount spent 0  Amount left 0  # of Animals 3  5 objects recall 3  Number series 2  Hour markers 2  Time correct 2  Placed X in triangle correctly 1  Largest Figure 1  Name of female 2  Date back to work 0  Type of work 2  State she lived in 0  Total score 21    Speech fluent and not dysarthric, language intact.  CN II-XII intact. Bulk and tone normal, muscle strength 5/5  throughout.  Sensation to light touch, temperature and vibration intact.  Deep tendon reflexes 2+ throughout, toes downgoing.  Finger to nose and heel to shin testing intact.  Gait normal, Romberg negative.     Samantha Clines, DO  CC:  Anastasia Pall. Lindell Noe, MD

## 2020-04-28 ENCOUNTER — Other Ambulatory Visit: Payer: Self-pay

## 2020-04-28 ENCOUNTER — Other Ambulatory Visit (INDEPENDENT_AMBULATORY_CARE_PROVIDER_SITE_OTHER): Payer: Medicare Other

## 2020-04-28 ENCOUNTER — Encounter: Payer: Self-pay | Admitting: Neurology

## 2020-04-28 ENCOUNTER — Ambulatory Visit: Payer: Medicare Other | Admitting: Neurology

## 2020-04-28 VITALS — BP 139/92 | HR 89 | Ht 68.0 in | Wt 164.6 lb

## 2020-04-28 DIAGNOSIS — R296 Repeated falls: Secondary | ICD-10-CM

## 2020-04-28 DIAGNOSIS — R413 Other amnesia: Secondary | ICD-10-CM | POA: Diagnosis not present

## 2020-04-28 LAB — VITAMIN B12: Vitamin B-12: 1016 pg/mL — ABNORMAL HIGH (ref 211–911)

## 2020-04-28 LAB — TSH: TSH: 1.25 u[IU]/mL (ref 0.35–4.50)

## 2020-04-28 NOTE — Patient Instructions (Addendum)
1.  Neurocognitive testing 2.  B12 and TSH. Your provider has requested that you have labwork completed today. Please go to [facility] ([id number]) on the second floor of this building before leaving the office today. You do not need to check in. If you are not called within 15 minutes please check with the front desk.  3.  Further recommendations pending results.

## 2020-04-29 ENCOUNTER — Telehealth: Payer: Self-pay

## 2020-04-29 NOTE — Telephone Encounter (Signed)
-----   Message from Pieter Partridge, DO sent at 04/29/2020  7:37 AM EST ----- B12 and thyroid okay

## 2020-04-29 NOTE — Telephone Encounter (Signed)
Pt called no answer left a voice mail per DPR that per Dr Tomi Likens Lab work was okay any questions she can call the office back

## 2020-05-13 ENCOUNTER — Encounter: Payer: Self-pay | Admitting: Counselor

## 2020-05-13 ENCOUNTER — Ambulatory Visit (INDEPENDENT_AMBULATORY_CARE_PROVIDER_SITE_OTHER): Payer: Medicare Other | Admitting: Counselor

## 2020-05-13 ENCOUNTER — Ambulatory Visit: Payer: Medicare Other

## 2020-05-13 ENCOUNTER — Other Ambulatory Visit: Payer: Self-pay

## 2020-05-13 DIAGNOSIS — G3184 Mild cognitive impairment, so stated: Secondary | ICD-10-CM | POA: Diagnosis not present

## 2020-05-13 DIAGNOSIS — F4329 Adjustment disorder with other symptoms: Secondary | ICD-10-CM

## 2020-05-13 DIAGNOSIS — F4321 Adjustment disorder with depressed mood: Secondary | ICD-10-CM

## 2020-05-13 DIAGNOSIS — F09 Unspecified mental disorder due to known physiological condition: Secondary | ICD-10-CM

## 2020-05-13 NOTE — Progress Notes (Signed)
[facility]  Patient Name: Samantha Bright MRN: [id number] Date of Birth: 11-19-1955 Age: 65 y.o. Education: 12 years  Referral Circumstances and Background Information  Ms. Priscila Bean is a 65 y.o., right-hand dominant, widowed woman (husband passed in 2020) with a history of gait instability likely related to osteoarthritis as per Dr. Tomi Likens, recurrent falls (3 major falls over past 3 years), and concerns about memory and thinking. Review of Dr. Georgie Chard notes shows the patient to have difficulties with misplacing things, forgetting what she wants to do when entering a room. She has a CT head from 03/19/2020 that shows normal morphology and volume for age.   On interview, the patient reported first noticing problems in 2018, although she initially attributed it to many things going on at once. She was caring for her mother who died around that time, she was doing [employer] work, and she was also working full time. Her husband also had a large stroke and required a skilled nursing level care, and then eventually passing in 2020. She moved to [facility] in July, 2021 and had lived in [place], [place] her entire life, which was a big change. She thinks her difficulties have been worsening over time. In terms of early changes, people started noticing that she was repeating herself (although interestingly enough she said that she was often the one who notices she repeats herself, which is inconsistent with a memory problem). She also appreciates some problems with language, she will say things backwards, for instance saying "fork reedy" instead of "reedy fork" or she will say "teeter harris" instead of "harris teeter." She also doesn't remember landmarks, she will comment on things that she sees and then the next day, she will comment on them again as though it is the first time she is seeing it. She has word finding problems. Her daughter stated that  sometimes she will mispronounce a word, although the patient doesn't notice that and I didn't appreciate any speech praxis or articulatory issues. She also has some minor difficulties with time relationships but doesn't lose the month or the year. She has a hard time remembering her schedule, she uses notes and a note pad and has stickies "everywhere." She will hide things and then not recall where they are unless she has written it down. Her daughter was working during the encounter but generally agreed with the patient's perception of difficulties and nodded her head in response to the patient's history.   With respect to mood, the patient was hesitant to admit to it but it sounds like she has a great deal of anxiety and struggles somewhat to feel positive. She acknowledged that she will get sad frequently and her daughter said she can be tearful. She was crying just recently when she heard a song that reminded her of her late husband. It sounds as though she has significant anxiety and gets worked up about things. Her daughter said she gets sticky with subjects and will "wear them out" talking about it. She was going to [place] a year back and was so worried about flying, she just "couldn't get my mind off it, the anxiety and the fear together were working on me." She was seeing a therapist back in [place], but has not reestablished since moving here in July, 2021 although she is doing a group grief counseling. It seems as though she has some prohibitions against taking psychiatric medication, which has been recommended in the past. Her energy level is "  very low" and she feels like it is hard to get things done. She takes supplements and thinks those may help. She is taking B12 with "a little bit of memory boost in it." Her weight has been stable lately, although she lost 20lbs after her husband passed. She reported that she is not sleeping well, 5 hours at most per night, and she realizes this may be a  contributor. She has had sleep problems for almost 20 years, difficulties both going to sleep and staying asleep.   With respect to functioning, the patient reported that she is still involved with the church, although not as much as previously. 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 She reported she is still driving and that is going well, she uses GPS. She never used a GPS before but she was able to learn that and she has no difficulties using her smart phone otherwise. She is managing her own finances she reported that is going well. She was able to sell her house fairly independently, her son did come to help with certain business aspects of it. She is still able to cook and is not having any significant difficulties, although she has left the stove on several times over the past few weeks (for several hours). Her daughter reported that she was rather frazzled one of those times and it was the anniversary of several grief moments and her daughter thinks that was contributory. She manages her own medications and misses or forgets them sometimes, but she uses a pill minder and is able to stay on top of them. She schedules all her own medical appointments. Her daughter thinks she still does a good job with judgment and problem solving, although her decisions are not quite as good as in the past. She has no problems using the community. She is able to keep up her house and the like (lives in an apartment with her daughter at present).   Past Medical History and Review of Relevant Studies  There are no problems to display for this patient.  Review of Neuroimaging and Relevant Medical History: CT head from  March 19, 2020 shows good brain morphology and volume for age with quite minimal volume loss. There are some bilateral areas of possible lacunar infarction vs prominent virchow robin spaces (I favor the latter) in the left basal ganglia and the right basal ganglia/temporal lobe, of dubious clinical significance. Overall the imaging does not suggest an etiology for the patients memory problems.   The patient has several falls that are extensively detailed in Dr. Georgie Chard notes. She reported that during the last fall, in January 2022 she thought she had a concussion but she denied any actual LOC associated with any of these events. CT of the head, reviewed above, was ordered as a result of that accident and did not reveal any acute intracranial abnormalities.   Current Outpatient Medications  Medication Sig Dispense Refill  . Ascorbic Acid 500 MG CHEW Vitamin C 500 mg chewable tablet  Take by oral route.    . celecoxib (CELEBREX) 200 MG capsule Take 200 mg by mouth as needed.    . cetirizine (ZYRTEC) 10 MG chewable tablet  Chew 10 mg by mouth daily.    . Cetirizine HCl (ZYRTEC ALLERGY) 10 MG CAPS Take 10 mg by mouth as needed.    . Cholecalciferol 50 MCG (2000 UT) CAPS Vitamin D3 50 mcg (2,000 unit) capsule  Take by oral route.    . clindamycin (CLEOCIN T) 1 % lotion clindamycin 1 % lotion    . famotidine (PEPCID) 40 MG tablet Take 40 mg by mouth daily.    . fluticasone (FLONASE) 50 MCG/ACT nasal spray Place into both nostrils daily.    . hydrochlorothiazide (HYDRODIURIL) 25 MG tablet Take 25 mg by mouth every morning.    Marland Kitchen LORazepam (ATIVAN) 1 MG tablet Take 1 tablet (1 mg total) by mouth every 8 (eight) hours as needed (muscle spasms). 15 tablet 0  . metroNIDAZOLE (METROCREAM) 0.75 % cream SMARTSIG:1 Topical Every Night    . Multiple Vitamin (MULTIVITAMIN ADULT PO) multivitamin    . naproxen (NAPROSYN) 500 MG tablet Take 1 tablet (500 mg total) by mouth 2 (two) times daily with a meal. 30 tablet  0  . nebivolol (BYSTOLIC) 10 MG tablet Take 10 mg by mouth daily.    Marland Kitchen nystatin-triamcinolone (MYCOLOG II) cream Apply topically.    . Probiotic Product (PROBIOTIC-10 PO) Breo Ellipta 100 mcg-25 mcg/dose powder for inhalation  INHALE 1 PUFF BY MOUTH EVERY DAY     No current facility-administered medications for this visit.    Family History  Problem Relation Age of Onset  . Stroke Mother   . Diabetes Mother    There is no  family history of dementia. There is no  family history of psychiatric illness.  Psychosocial History  Developmental, Educational and Employment History: The patient is a native of [place] and lived there her entire life until several years ago. She denied any history of abuse. She reported that she was a good student who was never held back and had no learning difficulties. She worked for a [employer] and stayed there for 41 years. 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 She retired 6 years ago, in 2016.   Psychiatric History: The patient denied any prior history of psychiatric difficulties. She was previously involved in counseling and was in counseling for approximately one year. She is not in any formal mental health treatment now. She is not taking lorazepam. She is averse to taking psychiatric medications, as per her daughter.   Substance Use History: The patient doesn't drink, has never used tobacco products, and she denied using any drugs.    Relationship History and Living Cimcumstances: The patient reported that she was with her husband for nearly 35 years and they have two children. She denied that her son has noticed any memory and thinking problems.   Mental Status and Behavioral Observations  Sensorium/Arousal: The patient's level of arousal was awake and alert.  Hearing and vision were adequate for testing purposes. Orientation: The patient was alert and fully oriented. Appearance: Dressed in appropriate, casual clothing with good grooming and hygiene.  Behavior: The patient was pleasant and appropriate. She had a tendency to be quite expansive in her history giving and spoke quickly, making it difficult to follow all the details sometimes.  Speech/language: Speech was somewhat fast in rate, otherwise normal in rhythm and volume. She had no obvious speech praxis issues or articulatory problems but  at times did mispronounce words, occasionally with phonemic paraphasic errors, but sometimes I think this may have been related to premorbid knowledge and/or regional dialect differences.  Gait/Posture: Not formally examined, normal with Dr. Tomi Likens Movement: Normal exam with neurology Social Comportment: Pleasant, appropriate Mood: "I'm allright" Affect: Neutral, although as per daughter she was tearful just recently and has a hard time with mood.  Thought process/content: The patient was somewhat expansive in her history, she would give a great level of detail and at times would get off topic. She had some circumstantiality. This has apparently always been her way and her daughter said it may be a bit worse than in the past. She responded well to redirection and had a good command of her history when prompted appropriately. No delusional thought content.  Safety: The patient identifies as a woman of strong faith and no safety concerns were identified at this visit.  Insight: Fair, patient if anything is overly concerned about what sound like mild problems.   Montreal Cognitive Assessment  05/13/2020  Visuospatial/ Executive (0/5) 2  Naming (0/3) 2  Attention: Read list of digits (0/2) 2  Attention: Read list of letters (0/1) 1  Attention: Serial 7 subtraction starting at 100 (0/3) 2  Language: Repeat phrase (0/2) 2  Language : Fluency (0/1) 1  Abstraction  (0/2) 1  Delayed Recall (0/5) 4  Orientation (0/6) 6  Total 23  Adjusted Score (based on education) 24   Test Procedures  Wide Range Achievement Test - 4             Word Reading Doy Mince' Intellectual Screening Test Neuropsychological Assessment Battery  Memory Module  Naming  Digit Span Repeatable Battery for the Assessment of Neuropsychological Status (Form A)  Figure Copy  Judgment of Line Orientation  Coding  Figure Recall The Dot Counting Test A Random Letter Test Controlled Oral Word Association (F-A-S) Semantic Fluency (Animals) Trail Making Test A & B Complex Ideational Material Modified [place] Card Sorting Test Geriatric Depression Scale - Short Form Quick Dementia Rating System (completed by daughter Naomie Dean)   Plan  Kobe Jansma was seen for a psychiatric diagnostic evaluation and neuropsychological testing.She is a pleasant, 65 year old, right-hand dominant woman with a lot of psychosocial stressors (death of mother 05/25/16, death of husband 26-May-2018, relocation to [place] from [place] where she had lived all her life) and concerns about memory and thinking over the past 4 years. She thinks her issues became more noticeable after a head injury in January, 2022, although she denied any actual loss of consciousness. Her daughter admits that she has noticed changes too although they are uncertain whether these represent issues related to the patients ongoing anxiety and grief or cognitive problems. Her history is equivocal and in her case, I think testing will be essential to ascertain whether this is due to an underlying condition or something else. Full and complete note with impressions, recommendations, and interpretation of test data to follow.   Viviano Simas Nicole Kindred, PsyD, [facility] Clinical Neuropsychologist  Informed Consent  Risks and benefits of the evaluation were discussed with the patient prior to all testing procedures. I conducted a clinical interview and  neuropsychological testing (at least two tests) with Illene Silver and Lamar Benes, B.S. (Technician) administered additional test procedures. The patient was able to tolerate the testing procedures and the patient (and/or family if applicable) is likely to benefit from further follow up to receive the diagnosis and treatment recommendations, which will be rendered at the next encounter.

## 2020-05-13 NOTE — Progress Notes (Signed)
   Psychometrist Note   Cognitive testing was administered to [name] by Samantha Bright, B.S. (Technician) under the supervision of Samantha Bright, Psy.D., ABN. Samantha Bright was able to tolerate all test procedures. Dr. Nicole Bright met with the patient as needed to manage any emotional reactions to the testing procedures. Rest breaks were offered.    The battery of tests administered was selected by Dr. Nicole Bright with consideration to the patient's current level of functioning, the nature of her symptoms, emotional and behavioral responses during the interview, level of literacy, observed level of motivation/effort, and the nature of the referral question. This battery was communicated to the psychometrist. Communication between Dr. Nicole Bright and the psychometrist was ongoing throughout the evaluation and Dr. Nicole Bright was immediately accessible at all times. Dr. Nicole Bright provided supervision to the technician on the date of this service, to the extent necessary to assure the quality of all services provided.    Samantha Bright will return in approximately one week for an interactive feedback session with Dr. Nicole Bright, at which time test performance, clinical impressions, and treatment recommendations will be reviewed in detail. The patient understands she can contact our office should she require our assistance before this time.   A total of 120 minutes of billable time were spent with Samantha Bright by the technician, including test administration and scoring time. Billing for these services is reflected in Samantha Bright note.   This note reflects time spent with the psychometrician and does not include test scores, clinical history, or any interpretations made by Dr. Nicole Bright. The full report will follow in a separate note.

## 2020-05-13 NOTE — Progress Notes (Signed)
[facility]  Patient Name: Samantha Bright MRN: [id number] Date of Birth: 11/30/1955 Age: 65 y.o. Education: 12 years  Measurement properties of test scores: IQ, Index, and Standard Scores (SS): Mean = 100; Standard Deviation = 15 Scaled Scores (Ss): Mean = 10; Standard Deviation = 3 Z scores (Z): Mean = 0; Standard Deviation = 1 T scores (T); Mean = 50; Standard Deviation = 10  TEST SCORES:    Note: This summary of test scores accompanies the interpretive report and should not be interpreted by unqualified individuals or in isolation without reference to the report. Test scores are relative to age, gender, and educational history as available and appropriate.   Performance Validity        "A" Random Letter Test Raw  Descriptor      Errors 0 Within Expectation  The Dot Counting Test: 14 Below Expectation      Embedded Measures: Raw  Descriptor      NAB Effort Index 1 Within Expectation      Mental Status Screening     Total Score Descriptor  MoCA 24 Normal      Expected Functioning        Wide Range Achievement Test: Standard/Scaled Score Percentile      Word Reading 96 39      Reynolds Intellectual Screening Test Standard/T-score Percentile      Guess What 46 34      Odd Item Out 47 38  RIST Index 95 37      Attention/Processing Speed        Neuropsychological Assessment Battery (Attention Module, Form 1): Scaled/T-score Percentile      Digits Forward 59 82      Digits Backwards 39 14      Repeatable Battery for the Assessment of Neuropsychological Status (Form A): Scaled Score Percentile      Coding 10 50      Language        Neuropsychological Assessment Battery (Language Module, Form 1): T-score Percentile      Naming   (28) 40 16      Verbal Fluency:  T Score Percentile      Controlled Oral Word Association (F-A-S) 62 88      Semantic Fluency (Animals) 53 62      Memory:        Neuropsychological Assessment  Battery (Memory Module, Form 1): T-score/Standard Score Percentile  Memory Index (MEM): 96 39      List Learning           List A Immediate Recall   (5 , 6 , 9) 47 38         List B Immediate Recall   (5) 57 75         List A Short Delayed Recall   (3) 33 5         List A Long Delayed Recall   (5) 43 25         List A Percent Retention   (167 %) --- 99         List A Long Delayed Yes/No Recognition Hits   (10) --- 31         List A Long Delayed Yes/No Recognition False Alarms   (6) --- 21         List A Recognition Discriminability Index --- 16      Shape Learning           Immediate Recognition   (  6 , 7 , 7) 64 92         Delayed Recognition   (7) 61 86         Percent Retention   (100 %) --- 54         Delayed Forced-Choice Recognition Hits   (9) --- 84         Delayed Forced-Choice Recognition False Alarms   (2) --- 27         Delayed Forced-Choice Recognition Discriminability --- 58      Story Learning           Immediate Recall   (26, 34) 50 50         Delayed Recall   (31) 49 46         Percent Retention   (91 %) --- 54      Daily Living Memory            Immediate Recall   (20, 23) 54 66          Delayed Recall   (6, 4) 38 12          Percent Retention (63 %) --- 5          Recognition Hits   (9) --- 58      Repeatable Battery for the Assessment of Neuropsychological Status (Form A): Scaled Score Percentile         Figure Recall   (13) 10 50      Visuospatial/Constructional Functioning        Repeatable Battery for the Assessment of Neuropsychological Status (Form A): Standard/Scaled Score Percentile      Visuospatial/Constructional Index 105 63         Figure Copy   (20) 14 91         Judgment of Line Orientation   (15) --- 26-50      Executive Functioning        [employer] Test (MWCST): Standard/T-Score Percentile      Number of Categories Correct 34 5      Number of Perseverative Errors 35 7      Number of Total Errors 31 3      Percent  Perseverative Errors 45 31  Executive Function Composite 74 4      Trail Making Test: T-Score Percentile      Part A 66 95      Part B 64 92      [place] Diagnostic Aphasia Exam: Raw Score Scaled Score      Complex Ideational Material 9 5      Clock Drawing Raw Score Descriptor      Command 7 Mild Impairment      Rating Scales        Clinical Dementia Rating Raw Score Descriptor      Sum of Boxes 1.5 Mild Cognitive Impairment      Global Score 0.5 MCI      Quick Dementia Rating System Raw Score Descriptor      Sum of Boxes 1.5 MCI      Total Score 3 MCI  Geriatric Depression Scale - Short Form 7 Positive   Bronsen Serano V. Nicole Kindred PsyD, [place] Clinical Neuropsychologist

## 2020-05-17 NOTE — Progress Notes (Signed)
[facility]  Patient Name: Samantha Bright MRN: [id number] Date of Birth: 11-20-1955 Age: 65 y.o. Education: 12 years  Clinical Impressions  Samantha Bright is a 65 y.o., right-hand dominant, widowed woman with a history of multiple recent psychosocial changes (husband passed ~2 years ago, mother before that, relocated to [place] under 1y ago). She has frequent falls, 3 major falls over the last year, and is concerned about her memory and thinking abilities. She was referred by my neurology colleague Dr. Tomi Likens who thought her falls were likely related to osteo arthritis. On interview, she and her daughter report progressive cognitive decline over the past approximately 4 years with some worsening after her recent fall when she hit her head (did not lose consciousness). She is functioning quite well, however, and was able to essentially arrange the sale of her home herself, move into a new apartment, she has been able to continue driving despite the fact that [place] is unfamiliar to her, and she is managing all finances and medications. She has a head CT from January, 2022 that is unremarkable and shows good brain volume and morphology for age. She does admit to some negative affect with all the loss and stressors in her life.   On neuropsychological testing, Samantha Bright generally performed quite well. Her overall expected ability fell within the average range and her performance on memory measures, tests of attention, language, processing speed, and visuospatial function was within expectation of that standard. She did have some scattered low executive scores but then did very well, near the superior range, on the challenging trail making test B, a very difficult executive function task. There is no indication of memory storage problems. She screened positive for the presence of depression and admits as much, although she identifies as a woman of faith and I  had the sense that it is difficult for her to admit to any affective distress.   Samantha Bright is thus testing essentially within normal limits with the exception of some scattered low scores on executive measures. Given the numerous recent changes in her life, her poor sleep, and what appears to be an active adjustment related/depressive disorder, I do not think this meets the burden required for a formal diagnosis of mild cognitive impairment. Rather, the findings are consistent with diminished cognitive efficiency related to the above noted factors that is likely reversible. Will counsel her regarding sleep hygiene, stress management, and mindfulness. I think she would do well in counseling and she may also benefit from an antidepressant, which I will discuss with her. She can return for reevaluation 1 or 2 years although I do not think that is needed unless she appreciates some decline because her test data are fairly benign. I will also reassure her that her head injury is unlikely exerting any significant influence now several months out, given that it was mild.   Diagnostic Impressions: Adjustment disorder with mixed anxiety and depressed mood Other symptoms and signs involving cognitive functions and awareness  Recommendations to be discussed with patient  Your performance and presentation on neuropsychological assessment was essentially normal. That is to say, you performed well in almost all areas. You did have some scattered low scores on measures of executive function, but you also rushed on some of those tests and I do not think these findings meet the burden for a frank diagnosis of Mild Cognitive Impairment, which is usually a term used to describe conditions that are at risk for decline. I  am not convinced at all that you are at risk for decline and rather think you have some minor interference from reversible causes such as your poor sleep, difficulties with adjustment and mood, and the amount  of stress you have gone through lately. This is good and means that your difficulties may improve, with time, and resolution of the following issues.   For starters, you have had numerous changes in your life that would be objectively difficult for anyone to deal with. You screened positive for the presence of depression and do admit it is hard for you to keep your spirits up despite your best attempts. Whether we wish to label this "depression" or an "adjustment reaction" (I.e., difficulties adapting to challenging circumstances in ones life), the treatment is the same. I think you would do well in psychotherapy and can offer you a referral. If you wish, you could also consult with a provider regarding medications.   In terms of your head injury, I do not think that is playing any significant role in your memory problems. Most of the time when an individual strikes their head and loses consciousness for a brief period of time or simply experiences an alteration of consciousness, there is a full and complete recovery to prior levels of functioning in a matter of days to weeks. Sometimes, this can take a bit longer in elderly individuals, but a full recovery is still expected, and that would be my expectation for you.   I would like to caution you about the use of supplements for memory loss. While things such as obtaining good nutrition, making sure you are getting adequate vitamins and minerals and the like are very important for brain health, taking supplements beyond meeting these basic needs is unlikely to be helpful. B12 for example is not found to be cognitively enhancing. Many of the products sold as cognitive supplements are of unproven value. If you have the money and wish to spend it that way or believe that the products are helping you, then by all means, although this is not something I typically recommend to my patients. Taking a basic multivitamin, eating a diet rich in fruits, vegetables, and  nutritive food sources (e.g., whole foods that are not overly processed) is likely a better route to go.   Prevagen is marketed by [employer] as a supplement to improve memory and support brain health. It contains Vitamin D3 and Apoaequorin. Vitamin D is a nutrient created by the body when it is exposed to sunlight. Apoaequorin is a type of protein found in jellyfish. Unfortunately, there is no credible evidence that Apoaequorin contributes to memory functioning or that it is even absorbed by the body after being taken orally. The maker of prevagen has been sued by the FDA related to "false and deceptive marketing practices." Therefore, I do not recommend this supplement to my patients and instead recommend that they stay healthy and mentally active and engage in other lifestyle changes that have been shown to be beneficial in numerous high quality studies.   There are few things as disruptive to brain functioning as not getting a good night's sleep. For sleep, I recommend against using medications, which can have lingering sedating effects on the brain and rob your brain of restful REM sleep. Instead, consider trying some of the following sleep hygiene recommendations. They may not work at once and may take effort, but the effort you spend is likely to be rewarded with better sleep eventually:  . Stick  to a sleep schedule of the same bedtime and wake time even on the weekends, which can help to regulate your body's internal clock so that you fall asleep and stay asleep.  . Practice a relaxing bedtime ritual (conducted away from bright lights) which will help separate your sleep from stimulating activities and prepare your body to fall asleep when you go to bed.  . Avoid naps, especially in the afternoon.  . Evaluate your room and create conditions that will promote sleep such as keeping it cool (between 60 - 67 degrees), quiet, and free from any lights. Consider using blackout curtains, a "white  noise" generator, or fan that will help mask any noises that might prevent you from going to sleep or awaken you during the night.  . Sleep on a comfortable mattress and pillows.  . Avoid bright light in the evening and excessive use of portable electronic devices right before bed that may contain light frequencies that can contribute to sleep problems.  . Avoid alcohol, cigarettes, or heavy meals in the evening. If you must eat, consume a light snack 45 minutes before bed.  . Use your bed only for sleep to strengthen the association between your bed and sleep.  . If you can't go to sleep within 30 minutes, go into another room and do something relaxing until you feel tired. Then, come back and try to go to sleep again for 30 minutes and repeat until sleep is achieved.  . Some people find over the counter melatonin to be helpful for sleep, which you could discuss with a pharmacist or prescribing provider.   Some cognitive abilities (such as processing speed) naturally decline with age, but there are many things you can do to contribute to healthy cognitive aging. There is evidence from at least one study that a modified low carbohydrate mediterranean diet (the MIND-DASH) diet can contribute to healthy cognitive aging. There is also some evidence to suggest a beneficial effect of coffee (black coffee without sugar or other additives such as cream) for healthy aging. One glass of red wine a day may also contribute to healthy aging though at two drinks you lose all benefit and at three drinks it may be doing more harm than good. Staying active, mentally and physically, are also crucially important. One of the best ways to do this is simply to stay active and engaged in your life, particularly with social activities. Challenging the mind and other cognitively stimulating activities are encouraged, consider learning a new hobby, reconnecting with old friends, reading an interesting and thought provoking book. It  is not so much what you do that is important as it is that you enjoy it and stay at it.   There is no reason from a neuropsychological perspective why you would not be able to lead a church group, although I would like to be candid with you that predicting real world performance with neuropsychological tests is frought with difficulty and this is not a question I can answer with a high degree of certainty.   Test Findings  Test scores are summarized in additional documentation associated with this encounter. Test scores are relative to age, gender, and educational history as available and appropriate. There were no concerns about performance validity as all findings fell within normal expectations.   General Intellectual Functioning/Achievement:  Performance on single word reading and the RIST index was average with comparable average range performance on the visually and verbally oriented subtests. Average thus presents as a  reasonable standard of comparison for the patient's cognitive test data.   Attention and Processing Efficiency: Performance on indicators of attention and working memory was reasonable, with average digit repetition forward and low average digit repetition backward. Serial subtraction of 7/s from 100 was 3/5.  With respect to processing speed, timed number symbol coding was average. Simple numeric sequencing was superior.   Language: Performance on language measures was reasonable, with low average normal range visual object confrontation naming. Generation of words in response to the letters F-A-S was high average whereas generation of animals in one minute was average.   Visuospatial Function: Visuospatial and constructional functioning fell at an average level overall on the relevant index from the RBANS. Performance was errorless on figure copy, whereas judgment of angular line orientations was average.   Learning and Memory: Measures of learning and memory showed good  performance, with an average overall performance level on the Memory Index of the NAB. Adequate acquisition and retention of information across time were demonstrated for both visual and verbal material.   In the verbal realm, Ms. Worth learned 5, 6, and 9 words of a 12-item word list, which is average. She retained 3 of those words on short delayed recall (unusually low performance level) but then 5 of those words on long delayed recall, which is low average. Recognition discriminability for the words under a forced choice paradigm was low average. Memory for a short story was average on immediate and delayed recall. Memory for daily living type information was average on immediate recall with low average delayed recall and in this case, her recognition performance was good with an average score.   In the visual realm, learning a series of designs that is difficult to verbally encode was superior and delayed recall was high average. Recognition discriminability was average using a forced-choice recognition format. Delayed free recall for a modestly complex figure was average.   Executive Functions: Performance on measures of executive abilities was weaker than in other areas; however, some of this may have been on the basis of approach to the task. The patient is aware she has a tendency to be impulsive and she admitted that she was responding impulsively on the [facility]. When she slowed down, her performance improved. The Executive Function Composite on that measure was unusually low with unusually low scores for both categories completed and perseverative errors. Her clock drawing was also mildly impaired with stiumulus bound hand placement and minor errors in numbering. Unusually low performance was demonstrated on the Complex Ideational Material, which involves reasoning with verbally presented information. By contrast, she did extremely well with a superior range score on the  challenging Trail making Test B. She also did well with a high average score generating words in response to the letters F-A-S.   Rating Scale(s): Ms. Petti screened positive for the presence of depression. She was characterized as functioning more at an MCI to questionable impairment level than a dementia level by her daughter on severity status staging. I was able to rate a CDR for her and would place her at a Sum of Boxes of 1.5, a global score of 0.5, which is questionable impairment.   Viviano Simas Nicole Kindred, PsyD, ABN Clinical Neuropsychologist  Coding and Compliance  Billing below reflects technician time, my direct face-to-face time with the patient, time spent in test administration, and time spent in professional activities including but not limited to: neuropsychological test interpretation, integration of neuropsychological test data with clinical  history, report preparation, treatment planning, care coordination, and review of diagnostically pertinent medical history or studies.   Services associated with this encounter: Clinical Interview [phone]) plus 160 minutes (96132/96133; Neuropsychological Evaluation by Professional)  21 minutes (96136/96137; Test Administration by Professional) 120 minutes (96138/96139; Neuropsychological Testing by Technician)

## 2020-05-23 ENCOUNTER — Encounter: Payer: Self-pay | Admitting: Counselor

## 2020-05-23 ENCOUNTER — Ambulatory Visit (INDEPENDENT_AMBULATORY_CARE_PROVIDER_SITE_OTHER): Payer: Medicare Other | Admitting: Counselor

## 2020-05-23 ENCOUNTER — Other Ambulatory Visit: Payer: Self-pay

## 2020-05-23 DIAGNOSIS — F4323 Adjustment disorder with mixed anxiety and depressed mood: Secondary | ICD-10-CM | POA: Diagnosis not present

## 2020-05-23 NOTE — Patient Instructions (Signed)
Your performance and presentation on neuropsychological assessment was essentially normal. That is to say, you performed well in almost all areas. You did have some scattered low scores on measures of executive function, but you also rushed on some of those tests and I do not think these findings meet the burden for a frank diagnosis of Mild Cognitive Impairment, which is usually a term used to describe conditions that are at risk for decline. I am not convinced at all that you are at risk for decline and rather think you have some minor interference from reversible causes such as your poor sleep, difficulties with adjustment and mood, and the amount of stress you have gone through lately. This is good and means that your difficulties may improve, with time, and resolution of the following issues.   For starters, you have had numerous changes in your life that would be objectively difficult for anyone to deal with. You screened positive for the presence of depression and do admit it is hard for you to keep your spirits up despite your best attempts. Whether we wish to label this "depression" or an "adjustment reaction" (I.e., difficulties adapting to challenging circumstances in ones life), the treatment is the same. I think you would do well in psychotherapy and can offer you a referral. If you wish, you could also consult with a provider regarding medications.   In terms of your head injury, I do not think that is playing any significant role in your memory problems. Most of the time when an individual strikes their head and loses consciousness for a brief period of time or simply experiences an alteration of consciousness, there is a full and complete recovery to prior levels of functioning in a matter of days to weeks. Sometimes, this can take a bit longer in elderly individuals, but a full recovery is still expected, and that would be my expectation for you.   I would like to caution you about the use  of supplements for memory loss. While things such as obtaining good nutrition, making sure you are getting adequate vitamins and minerals and the like are very important for brain health, taking supplements beyond meeting these basic needs is unlikely to be helpful. B12 for example is not found to be cognitively enhancing. Many of the products sold as cognitive supplements are of unproven value. If you have the money and wish to spend it that way or believe that the products are helping you, then by all means, although this is not something I typically recommend to my patients. Taking a basic multivitamin, eating a diet rich in fruits, vegetables, and nutritive food sources (e.g., whole foods that are not overly processed) is likely a better route to go.   Prevagen is marketed by [employer] as a supplement to improve memory and support brain health. It contains Vitamin D3 and Apoaequorin. Vitamin D is a nutrient created by the body when it is exposed to sunlight. Apoaequorin is a type of protein found in jellyfish. Unfortunately, there is no credible evidence that Apoaequorin contributes to memory functioning or that it is even absorbed by the body after being taken orally. The maker of prevagen has been sued by the FDA related to "false and deceptive marketing practices." Therefore, I do not recommend this supplement to my patients and instead recommend that they stay healthy and mentally active and engage in other lifestyle changes that have been shown to be beneficial in numerous high quality studies.   There are  few things as disruptive to brain functioning as not getting a good night's sleep. For sleep, I recommend against using medications, which can have lingering sedating effects on the brain and rob your brain of restful REM sleep. Instead, consider trying some of the following sleep hygiene recommendations. They may not work at once and may take effort, but the effort you spend is likely to be  rewarded with better sleep eventually:   Stick to a sleep schedule of the same bedtime and wake time even on the weekends, which can help to regulate your body's internal clock so that you fall asleep and stay asleep.   Practice a relaxing bedtime ritual (conducted away from bright lights) which will help separate your sleep from stimulating activities and prepare your body to fall asleep when you go to bed.   Avoid naps, especially in the afternoon.   Evaluate your room and create conditions that will promote sleep such as keeping it cool (between 60 - 67 degrees), quiet, and free from any lights. Consider using blackout curtains, a "white noise" generator, or fan that will help mask any noises that might prevent you from going to sleep or awaken you during the night.   Sleep on a comfortable mattress and pillows.   Avoid bright light in the evening and excessive use of portable electronic devices right before bed that may contain light frequencies that can contribute to sleep problems.   Avoid alcohol, cigarettes, or heavy meals in the evening. If you must eat, consume a light snack 45 minutes before bed.   Use your bed only for sleep to strengthen the association between your bed and sleep.   If you can't go to sleep within 30 minutes, go into another room and do something relaxing until you feel tired. Then, come back and try to go to sleep again for 30 minutes and repeat until sleep is achieved.   Some people find over the counter melatonin to be helpful for sleep, which you could discuss with a pharmacist or prescribing provider.   Some cognitive abilities (such as processing speed) naturally decline with age, but there are many things you can do to contribute to healthy cognitive aging. There is evidence from at least one study that a modified low carbohydrate mediterranean diet (the MIND-DASH) diet can contribute to healthy cognitive aging. There is also some evidence to suggest a  beneficial effect of coffee (black coffee without sugar or other additives such as cream) for healthy aging. One glass of red wine a day may also contribute to healthy aging though at two drinks you lose all benefit and at three drinks it may be doing more harm than good. Staying active, mentally and physically, are also crucially important. One of the best ways to do this is simply to stay active and engaged in your life, particularly with social activities. Challenging the mind and other cognitively stimulating activities are encouraged, consider learning a new hobby, reconnecting with old friends, reading an interesting and thought provoking book. It is not so much what you do that is important as it is that you enjoy it and stay at it.   There is no reason from a neuropsychological perspective why you would not be able to lead a church group, although I would like to be candid with you that predicting real world performance with neuropsychological tests is frought with difficulty and this is not a question I can answer with a high degree of certainty.

## 2020-05-23 NOTE — Progress Notes (Signed)
[facility]  Feedback Note: I met with Samantha Bright to review the findings resulting from her neuropsychological evaluation. Since the last appointment, she has been about the same. Time was spent reviewing the impressions and recommendations that are detailed in the evaluation report. We discussed impression of essentially normal cognitive abilities, as reflected in the patient instructions. She wonders if she may have dyslexia, which she did not mention at the initial interview, and the present evaluation was not designed to detect that condition. Discussed diet and exercise as well as stress management as helpful for mitigating chances of decline in the future. She also accepted a referral for counseling. She is involved in a grief group but may benefit from individual therapy also. I took time to explain the findings and answer all the patient's questions. I encouraged Ms. Odor to contact me should she have any further questions or if further follow up is desired.   Current Medications and Medical History   Current Outpatient Medications  Medication Sig Dispense Refill  . Ascorbic Acid 500 MG CHEW Vitamin C 500 mg chewable tablet  Take by oral route.    . celecoxib (CELEBREX) 200 MG capsule Take 200 mg by mouth as needed.    . cetirizine (ZYRTEC) 10 MG chewable tablet Chew 10 mg by mouth daily.    . Cetirizine HCl (ZYRTEC ALLERGY) 10 MG CAPS Take 10 mg by mouth as needed.    . Cholecalciferol 50 MCG (2000 UT) CAPS Vitamin D3 50 mcg (2,000 unit) capsule  Take by oral route.    . clindamycin (CLEOCIN T) 1 % lotion clindamycin 1 % lotion    . famotidine (PEPCID) 40 MG tablet Take 40 mg by mouth daily.    . fluticasone (FLONASE) 50 MCG/ACT nasal spray Place into both nostrils daily.    . hydrochlorothiazide (HYDRODIURIL) 25 MG tablet Take 25 mg by mouth every morning.    [employer]land [employer] LORazepam (ATIVAN) 1 MG tablet Take 1 tablet (1 mg total) by mouth every 8  (eight) hours as needed (muscle spasms). 15 tablet 0  . metroNIDAZOLE (METROCREAM) 0.75 % cream SMARTSIG:1 Topical Every Night    . Multiple Vitamin (MULTIVITAMIN ADULT PO) multivitamin    . naproxen (NAPROSYN) 500 MG tablet Take 1 tablet (500 mg total) by mouth 2 (two) times daily with a meal. 30 tablet 0  . nebivolol (BYSTOLIC) 10 MG tablet Take 10 mg by mouth daily.    Marland Kitchen nystatin-triamcinolone (MYCOLOG II) cream Apply topically.    . Probiotic Product (PROBIOTIC-10 PO) Breo Ellipta 100 mcg-25 mcg/dose powder for inhalation  INHALE 1 PUFF BY MOUTH EVERY DAY     No current facility-administered medications for this visit.    There are no problems to display for this patient.   Mental Status and Behavioral Observations  Samantha Bright presented on time to the present encounter and was alert and generally oriented. Speech was normal in rate, rhythm, volume, and prosody. Self-reported mood was "good" and affect was neutral, perhaps a bit anxious. Thought process was circumstantial and thought content was appropriate. There were no safety concerns identified at today's encounter, such as thoughts of harming self or others.   Plan  Feedback provided regarding the patient's neuropsychological evaluation. She has essentially normal test performance, with perhaps some very marginal low scores on measures of executive abilities, that I do not think quite meet the burden for a frank MCI diagnosis. Recommended she work on sleep, healthy lifestyle changes, and start counseling.  Samantha Bright was encouraged to contact me if any questions arise or if further follow up is desired.   Samantha Bright Samantha Kindred, PsyD, ABN Clinical Neuropsychologist  Service(s) Provided at This Encounter: 33 minutes [phone]; Psychotherapy with patient/family)

## 2020-05-30 ENCOUNTER — Ambulatory Visit: Payer: PRIVATE HEALTH INSURANCE | Admitting: Neurology

## 2020-06-28 DIAGNOSIS — L821 Other seborrheic keratosis: Secondary | ICD-10-CM | POA: Diagnosis not present

## 2020-06-28 DIAGNOSIS — L718 Other rosacea: Secondary | ICD-10-CM | POA: Diagnosis not present

## 2020-06-28 DIAGNOSIS — D229 Melanocytic nevi, unspecified: Secondary | ICD-10-CM | POA: Diagnosis not present

## 2020-06-28 DIAGNOSIS — L816 Other disorders of diminished melanin formation: Secondary | ICD-10-CM | POA: Diagnosis not present

## 2020-06-28 DIAGNOSIS — L668 Other cicatricial alopecia: Secondary | ICD-10-CM | POA: Diagnosis not present

## 2020-06-28 DIAGNOSIS — L280 Lichen simplex chronicus: Secondary | ICD-10-CM | POA: Diagnosis not present

## 2020-06-28 DIAGNOSIS — L819 Disorder of pigmentation, unspecified: Secondary | ICD-10-CM | POA: Diagnosis not present

## 2020-07-05 DIAGNOSIS — M25562 Pain in left knee: Secondary | ICD-10-CM | POA: Diagnosis not present

## 2020-07-05 DIAGNOSIS — I10 Essential (primary) hypertension: Secondary | ICD-10-CM | POA: Diagnosis not present

## 2020-07-05 DIAGNOSIS — R109 Unspecified abdominal pain: Secondary | ICD-10-CM | POA: Diagnosis not present

## 2020-07-05 DIAGNOSIS — E785 Hyperlipidemia, unspecified: Secondary | ICD-10-CM | POA: Diagnosis not present

## 2020-07-19 DIAGNOSIS — R1310 Dysphagia, unspecified: Secondary | ICD-10-CM | POA: Diagnosis not present

## 2020-07-19 DIAGNOSIS — Z86010 Personal history of colonic polyps: Secondary | ICD-10-CM | POA: Diagnosis not present

## 2020-07-19 DIAGNOSIS — K6389 Other specified diseases of intestine: Secondary | ICD-10-CM | POA: Diagnosis not present

## 2020-07-19 DIAGNOSIS — K640 First degree hemorrhoids: Secondary | ICD-10-CM | POA: Diagnosis not present

## 2020-07-19 DIAGNOSIS — K219 Gastro-esophageal reflux disease without esophagitis: Secondary | ICD-10-CM | POA: Diagnosis not present

## 2020-07-22 DIAGNOSIS — K219 Gastro-esophageal reflux disease without esophagitis: Secondary | ICD-10-CM | POA: Diagnosis not present

## 2020-08-01 DIAGNOSIS — Z20822 Contact with and (suspected) exposure to covid-19: Secondary | ICD-10-CM | POA: Diagnosis not present

## 2020-08-23 DIAGNOSIS — M25561 Pain in right knee: Secondary | ICD-10-CM | POA: Diagnosis not present

## 2020-08-23 DIAGNOSIS — M25562 Pain in left knee: Secondary | ICD-10-CM | POA: Diagnosis not present

## 2020-09-14 DIAGNOSIS — J9801 Acute bronchospasm: Secondary | ICD-10-CM | POA: Diagnosis not present

## 2020-09-27 ENCOUNTER — Telehealth: Payer: Self-pay

## 2020-09-30 ENCOUNTER — Other Ambulatory Visit (HOSPITAL_COMMUNITY): Payer: Self-pay | Admitting: Family Medicine

## 2020-09-30 DIAGNOSIS — R0602 Shortness of breath: Secondary | ICD-10-CM | POA: Diagnosis not present

## 2020-09-30 DIAGNOSIS — I10 Essential (primary) hypertension: Secondary | ICD-10-CM | POA: Diagnosis not present

## 2020-09-30 DIAGNOSIS — E785 Hyperlipidemia, unspecified: Secondary | ICD-10-CM | POA: Diagnosis not present

## 2020-10-05 DIAGNOSIS — H40213 Acute angle-closure glaucoma, bilateral: Secondary | ICD-10-CM | POA: Diagnosis not present

## 2020-10-07 ENCOUNTER — Other Ambulatory Visit: Payer: Self-pay

## 2020-10-07 ENCOUNTER — Ambulatory Visit (HOSPITAL_COMMUNITY)
Admission: RE | Admit: 2020-10-07 | Discharge: 2020-10-07 | Disposition: A | Discharge status: Home / Self Care | Payer: Self-pay | Source: Ambulatory Visit | Attending: Family Medicine | Admitting: Family Medicine

## 2020-10-07 DIAGNOSIS — Z136 Encounter for screening for cardiovascular disorders: Secondary | ICD-10-CM | POA: Insufficient documentation

## 2020-10-13 DIAGNOSIS — K219 Gastro-esophageal reflux disease without esophagitis: Secondary | ICD-10-CM | POA: Diagnosis not present

## 2020-10-13 DIAGNOSIS — K5900 Constipation, unspecified: Secondary | ICD-10-CM | POA: Diagnosis not present

## 2020-11-08 DIAGNOSIS — Z23 Encounter for immunization: Secondary | ICD-10-CM | POA: Diagnosis not present

## 2020-12-15 ENCOUNTER — Ambulatory Visit (HOSPITAL_BASED_OUTPATIENT_CLINIC_OR_DEPARTMENT_OTHER): Payer: Medicare Other | Admitting: Cardiovascular Disease

## 2020-12-15 ENCOUNTER — Other Ambulatory Visit: Payer: Self-pay

## 2020-12-15 ENCOUNTER — Encounter (HOSPITAL_BASED_OUTPATIENT_CLINIC_OR_DEPARTMENT_OTHER): Payer: Self-pay | Admitting: Cardiovascular Disease

## 2020-12-15 VITALS — BP 118/76 | HR 81 | Ht 68.0 in | Wt 167.0 lb

## 2020-12-15 DIAGNOSIS — R0609 Other forms of dyspnea: Secondary | ICD-10-CM | POA: Diagnosis not present

## 2020-12-15 DIAGNOSIS — I2510 Atherosclerotic heart disease of native coronary artery without angina pectoris: Secondary | ICD-10-CM | POA: Diagnosis not present

## 2020-12-15 DIAGNOSIS — E7800 Pure hypercholesterolemia, unspecified: Secondary | ICD-10-CM

## 2020-12-15 DIAGNOSIS — I10 Essential (primary) hypertension: Secondary | ICD-10-CM

## 2020-12-15 HISTORY — DX: Pure hypercholesterolemia, unspecified: E78.00

## 2020-12-15 HISTORY — DX: Atherosclerotic heart disease of native coronary artery without angina pectoris: I25.10

## 2020-12-15 HISTORY — DX: Essential (primary) hypertension: I10

## 2020-12-15 MED ORDER — METOPROLOL TARTRATE 100 MG PO TABS: ORAL_TABLET | ORAL | 0 refills | Status: DC

## 2020-12-15 MED ORDER — PREDNISONE 50 MG PO TABS: ORAL_TABLET | ORAL | 0 refills | Status: DC

## 2020-12-15 NOTE — Patient Instructions (Addendum)
Medication Instructions:  TAKE METOPROLOL 100 MG 1 TABLET 2 HOURS PRIOR TO CT   THE FOLLOWING WILL NEED TO BE TAKEN PRIOR TO CT  Prednisone 50 mg - take 13 hours prior to test Take another Prednisone 50 mg 7 hours prior to test Take another Prednisone 50 mg 1 hour prior to test Take Benadryl 50 mg 1 hour prior to test Patient must complete all four doses of above prophylactic medications. Patient will need a ride after test due to Benadryl. *If you need a refill on your cardiac medications before your next appointment, please call your pharmacy*  Lab Work: BMET 1 [place] CT   If you have labs (blood work) drawn today and your tests are completely normal, you will receive your results only by: [place] (if you have MyChart) OR A paper copy in the mail If you have any lab test that is abnormal or we need to change your treatment, we will call you to review the results.  Testing/Procedures: Your physician has requested that you have cardiac CT. Cardiac computed tomography (CT) is a painless test that uses an x-ray machine to take clear, detailed pictures of your heart. For further information please visit [email or web address]. Please follow instruction sheet as given. ONCE INSURANCE HAS BEEN REVIEWED THE OFFICE WILL CALL YOU TO ARRANGE   Follow-Up: At [facility], you and your health needs are our priority.  As part of our continuing mission to provide you with exceptional heart care, we have created designated Provider Care Teams.  These Care Teams include your primary Cardiologist (physician) and Advanced Practice Providers (APPs -  Physician Assistants and Nurse Practitioners) who all work together to provide you with the care you need, when you need it.  We recommend signing up for the patient portal called "MyChart".  Sign up information is provided on this After Visit Summary.  MyChart is used to connect with patients for Virtual Visits (Telemedicine).  Patients  are able to view lab/test results, encounter notes, upcoming appointments, etc.  Non-urgent messages can be sent to your provider as well.   To learn more about what you can do with MyChart, go to [email or web address].    Your next appointment:   12 month(s)  The format for your next appointment:   In Person  Provider:   Skeet Latch, MD   Other Instructions    Your cardiac CT will be scheduled at one of the below locations:   [facility] [address] [place], [place] [address] [phone]  [facility] [address] [place], [place] [address] [phone]  If scheduled at [facility], please arrive at the [facility] main entrance (entrance A) of [facility] 30 minutes prior to test start time. You can use the FREE valet parking offered at the main entrance (encouraged to control the heart rate for the test) Proceed to the [facility] Radiology Department (first floor) to check-in and test prep.  If scheduled at [facility], please arrive 15 mins early for check-in and test prep.  Please follow these instructions carefully (unless otherwise directed):  Hold all erectile dysfunction medications at least 3 days (72 hrs) prior to test.  On the Night Before the Test: Be sure to Drink plenty of water. Do not consume any caffeinated/decaffeinated beverages or chocolate 12 hours prior to your test. Do not take any antihistamines 12 hours prior to your test. If the  patient has contrast allergy: Patient will need a prescription for Prednisone and very clear instructions (as follows): Prednisone 50 mg - take 13 hours prior to test Take another Prednisone 50 mg 7 hours prior to test Take another Prednisone 50 mg 1 hour prior to test Take Benadryl 50 mg 1 hour prior to test Patient must complete all four doses of above prophylactic medications. Patient will need a  ride after test due to Benadryl.  On the Day of the Test: Drink plenty of water until 1 hour prior to the test. Do not eat any food 4 hours prior to the test. You may take your regular medications prior to the test.  Take metoprolol (Lopressor) two hours prior to test. HOLD Furosemide/Hydrochlorothiazide morning of the test. FEMALES- please wear underwire-free bra if available, avoid dresses & tight clothing  After the Test: Drink plenty of water. After receiving IV contrast, you may experience a mild flushed feeling. This is normal. On occasion, you may experience a mild rash up to 24 hours after the test. This is not dangerous. If this occurs, you can take Benadryl 25 mg and increase your fluid intake. If you experience trouble breathing, this can be serious. If it is severe call 911 IMMEDIATELY. If it is mild, please call our office. If you take any of these medications: Glipizide/Metformin, Avandament, Glucavance, please do not take 48 hours after completing test unless otherwise instructed.  Please allow 2-4 weeks for scheduling of routine cardiac CTs. Some insurance companies require a pre-authorization which may delay scheduling of this test.   For non-scheduling related questions, please contact the cardiac imaging nurse navigator should you have any questions/concerns: Marchia Bond, Cardiac Imaging Nurse Navigator Gordy Clement, Cardiac Imaging Nurse Navigator [facility] Direct Office Dial: [phone]   For scheduling needs, including cancellations and rescheduling, please call [place], [phone].  Cardiac CT Angiogram A cardiac CT angiogram is a procedure to look at the heart and the area around the heart. It may be done to help find the cause of chest pains or other symptoms of heart disease. During this procedure, a substance called contrast dye is injected into the blood vessels in the area to be checked. A large X-ray machine, called a CT  scanner, then takes detailed pictures of the heart and the surrounding area. The procedure is also sometimes called a coronary CT angiogram, coronary artery scanning, or CTA. A cardiac CT angiogram allows the health care provider to see how well blood is flowing to and from the heart. The health care provider will be able to see if there are any problems, such as: Blockage or narrowing of the coronary arteries in the heart. Fluid around the heart. Signs of weakness or disease in the muscles, valves, and tissues of the heart. Tell a health care provider about: Any allergies you have. This is especially important if you have had a previous allergic reaction to contrast dye. All medicines you are taking, including vitamins, herbs, eye drops, creams, and over-the-counter medicines. Any blood disorders you have. Any surgeries you have had. Any medical conditions you have. Whether you are pregnant or may be pregnant. Any anxiety disorders, chronic pain, or other conditions you have that may increase your stress or prevent you from lying still. What are the risks? Generally, this is a safe procedure. However, problems may occur, including: Bleeding. Infection. Allergic reactions to medicines or dyes. Damage to other structures or organs. Kidney damage from the contrast dye that  is used. Increased risk of cancer from radiation exposure. This risk is low. Talk with your health care provider about: The risks and benefits of testing. How you can receive the lowest dose of radiation. What happens before the procedure? Wear comfortable clothing and remove any jewelry, glasses, dentures, and hearing aids. Follow instructions from your health care provider about eating and drinking. This may include: For 12 hours before the procedure -- avoid caffeine. This includes tea, coffee, soda, energy drinks, and diet pills. Drink plenty of water or other fluids that do not have caffeine in them. Being well  hydrated can prevent complications. For 4-6 hours before the procedure -- stop eating and drinking. The contrast dye can cause nausea, but this is less likely if your stomach is empty. Ask your health care provider about changing or stopping your regular medicines. This is especially important if you are taking diabetes medicines, blood thinners, or medicines to treat problems with erections (erectile dysfunction). What happens during the procedure?  Hair on your chest may need to be removed so that small sticky patches called electrodes can be placed on your chest. These will transmit information that helps to monitor your heart during the procedure. An IV will be inserted into one of your veins. You might be given a medicine to control your heart rate during the procedure. This will help to ensure that good images are obtained. You will be asked to lie on an exam table. This table will slide in and out of the CT machine during the procedure. Contrast dye will be injected into the IV. You might feel warm, or you may get a metallic taste in your mouth. You will be given a medicine called nitroglycerin. This will relax or dilate the arteries in your heart. The table that you are lying on will move into the CT machine tunnel for the scan. The person running the machine will give you instructions while the scans are being done. You may be asked to: Keep your arms above your head. Hold your breath. Stay very still, even if the table is moving. When the scanning is complete, you will be moved out of the machine. The IV will be removed. The procedure may vary among health care providers and hospitals. What can I expect after the procedure? After your procedure, it is common to have: A metallic taste in your mouth from the contrast dye. A feeling of warmth. A headache from the nitroglycerin. Follow these instructions at home: Take over-the-counter and prescription medicines only as told by your  health care provider. If you are told, drink enough fluid to keep your urine pale yellow. This will help to flush the contrast dye out of your body. Most people can return to their normal activities right after the procedure. Ask your health care provider what activities are safe for you. It is up to you to get the results of your procedure. Ask your health care provider, or the department that is doing the procedure, when your results will be ready. Keep all follow-up visits as told by your health care provider. This is important. Contact a health care provider if: You have any symptoms of allergy to the contrast dye. These include: Shortness of breath. Rash or hives. A racing heartbeat. Summary A cardiac CT angiogram is a procedure to look at the heart and the area around the heart. It may be done to help find the cause of chest pains or other symptoms of heart disease. During this  procedure, a large X-ray machine, called a CT scanner, takes detailed pictures of the heart and the surrounding area after a contrast dye has been injected into blood vessels in the area. Ask your health care provider about changing or stopping your regular medicines before the procedure. This is especially important if you are taking diabetes medicines, blood thinners, or medicines to treat erectile dysfunction. If you are told, drink enough fluid to keep your urine pale yellow. This will help to flush the contrast dye out of your body. This information is not intended to replace advice given to you by your health care provider. Make sure you discuss any questions you have with your health care provider. Document Revised: 10/01/2018 Document Reviewed: 10/01/2018 Elsevier Patient Education  [place].

## 2020-12-15 NOTE — Progress Notes (Signed)
Cardiology Office Note:    Date:  12/15/2020   ID:  Samantha Bright, DOB January 16, 1956, MRN [id number]  PCP:  Glenis Smoker, MD   [facility] Providers Cardiologist:  None     Referring MD: Glenis Smoker, *   No chief complaint on file.   History of Present Illness:    Samantha Bright is a 65 y.o. female with a hx of hypertension, hyperlipidemia, coronary calcification, GERD, and sciatica here for the evaluation and management of arteriosclerotic heart disease. She had a coronary calcium score 09/2020 which revealed a score of 22 which was 69th percentile of age and gender. She was started on a statin and referred to cardiology for further evaluation.   Today, she is mainly concerned about her recent calcium score because of her family history. Overall she appears well, but has been feeling fatigued with minimal exertion, such as after vacuuming the floors or cleaning the bathroom. This is not typical for her. This week she has been suffering a flare-up of GERD. She notes having left chest pain inferior to her left breast radiating to her back which she attributes to GERD. She also complains of a persistent tightness in her central chest that she also believes is related to her acid reflux, and is present even while talking in clinic today. Typically she has issues with LE edema, worse in the summers and usually in her feet. Due to recurrent bronchitis and pneumonia in the past, she is now on fluticasone which manages her symptoms well. At this time her breathing is relatively stable, but she may be mildly short of breath at times, including after climbing a flight of stairs.  In 02/2020 she had a mechanical fall and injured her head. For a while she had subsequent headaches which are resolved today. For exercise, she is considering joining [employer]. In her diet, she enjoys fish and chicken, but avoids fried foods, red meats, and spicy foods. Lately she has been trying to eat  more vegetables and cut back on carbs such as rice and macaroni and cheese. She has never been a smoker. However, she states she likely has years of second-hand smoke due to her father being a smoker until he quit. In 02/2017 she was infected with COVID. Since May 2022 she has been on rosuvastatin. Of note, she reports having a previous reaction to contrast dye causing pruritis and burning sensations. She denies any palpitations, lightheadedness, syncope, orthopnea, or PND.    Past Medical History:  Diagnosis Date   CAD in native artery 12/15/2020   Constipation    Essential hypertension 12/15/2020   GERD (gastroesophageal reflux disease)    Hypertension    Pure hypercholesterolemia 12/15/2020   Sciatica     Past Surgical History:  Procedure Laterality Date   ROTATOR CUFF REPAIR      Current Medications: Current Meds  Medication Sig   amLODipine (NORVASC) 10 MG tablet Take 10 mg by mouth daily.   Ascorbic Acid 500 MG CHEW Vitamin C 500 mg chewable tablet  Take by oral route.   celecoxib (CELEBREX) 200 MG capsule Take 200 mg by mouth as needed.   cetirizine (ZYRTEC) 10 MG chewable tablet Chew 10 mg by mouth daily.   Cetirizine HCl (ZYRTEC ALLERGY) 10 MG CAPS Take 10 mg by mouth as needed.   Cholecalciferol 50 MCG (2000 UT) CAPS Vitamin D3 50 mcg (2,000 unit) capsule  Take by oral route.   clindamycin (CLEOCIN T) 1 % lotion clindamycin 1 %  lotion   famotidine (PEPCID) 40 MG tablet Take 40 mg by mouth daily.   fluticasone (FLONASE) 50 MCG/ACT nasal spray Place into both nostrils daily.   fluticasone furoate-vilanterol (BREO ELLIPTA) 100-25 MCG/ACT AEPB SMARTSIG:1 Puff(s) Via Inhaler Daily PRN   hydrochlorothiazide (HYDRODIURIL) 25 MG tablet Take 25 mg by mouth every morning.   LORazepam (ATIVAN) 1 MG tablet Take 1 tablet (1 mg total) by mouth every 8 (eight) hours as needed (muscle spasms).   metoprolol tartrate (LOPRESSOR) 100 MG tablet TAKE 1 TABLET 2 HOURS PRIOR TO CT    metroNIDAZOLE (METROCREAM) 0.75 % cream SMARTSIG:1 Topical Every Night   Multiple Vitamin (MULTIVITAMIN ADULT PO) multivitamin   naproxen (NAPROSYN) 500 MG tablet Take 1 tablet (500 mg total) by mouth 2 (two) times daily with a meal.   nystatin-triamcinolone (MYCOLOG II) cream Apply topically.   pantoprazole (PROTONIX) 40 MG tablet Take 40 mg by mouth daily.   predniSONE (DELTASONE) 50 MG tablet Take 1 tablet 13 hours prior to test, then one 7 hours prior to test, and then one 1 hour prior to test   Probiotic Product (PROBIOTIC-10 PO) Breo Ellipta 100 mcg-25 mcg/dose powder for inhalation  INHALE 1 PUFF BY MOUTH EVERY DAY   rosuvastatin (CRESTOR) 10 MG tablet Take 10 mg by mouth daily.     Allergies:   Contrast media [iodinated diagnostic agents] and Penicillins   Social History   Socioeconomic History   Marital status: Widowed    Spouse name: Not on file   Number of children: Not on file   Years of education: Not on file   Highest education level: Not on file  Occupational History   Not on file  Tobacco Use   Smoking status: Never   Smokeless tobacco: Never  Substance and Sexual Activity   Alcohol use: Not Currently   Drug use: Never   Sexual activity: Not on file  Other Topics Concern   Not on file  Social History Narrative   Right handed   Social Determinants of Health   Financial Resource Strain: Low Risk    Difficulty of Paying Living Expenses: Not hard at all  Food Insecurity: No Food Insecurity   Worried About Running Out of Food in the Last Year: Never true   [place] in the Last Year: Never true  Transportation Needs: No Transportation Needs   Lack of Transportation (Medical): No   Lack of Transportation (Non-Medical): No  Physical Activity: Inactive   Days of Exercise per Week: 0 days   Minutes of Exercise per Session: 0 min  Stress: Not on file  Social Connections: Not on file     Family History: The patient's family history includes Diabetes in  her mother; Heart attack (age of onset: 73) in her mother; Heart disease in her maternal uncle; Heart failure in her mother; Pancreatitis in her mother; Stroke in her mother.  ROS:   Please see the history of present illness.    (+) Central chest tightness (+) Left chest pain inferior to left breast, radiating to back (+) Fatigue (+) Mild shortness of breath (+) Bilateral LE edema, feet (+) Mechanical fall (02/2020) All other systems reviewed and are negative.  EKGs/Labs/Other Studies Reviewed:    The following studies were reviewed today:  Coronary Calcium Score 10/07/2020: FINDINGS: Coronary arteries: Normal origins.   Coronary Calcium Score:   Left main: 0   Left anterior descending artery: 13   Left circumflex artery: 0   Right coronary artery:  8   Total: 22   Percentile: 69   Pericardium: Normal.   Ascending Aorta: Normal caliber.   Non-cardiac: See separate report from [facility].   IMPRESSION: Coronary calcium score of 22. This was 69th percentile for age-, race-, and sex-matched controls.  EKG:    12/15/2020: Sinus rhythm. Rate 81 bpm. Incomplete RBBB.  Recent Labs: 03/19/2020: ALT 16; BUN 14; Creatinine, Ser 0.58; Hemoglobin 13.6; Platelets 226; Potassium 4.0; Sodium 143 04/28/2020: TSH 1.25  Recent Lipid Panel No results found for: CHOL, TRIG, HDL, CHOLHDL, VLDL, LDLCALC, LDLDIRECT       Physical Exam:    Wt Readings from Last 3 Encounters:  12/15/20 167 lb (75.8 kg)  04/28/20 164 lb 9.6 oz (74.7 kg)  03/11/20 160 lb (72.6 kg)     VS:  BP 118/76 (BP Location: Right Arm, Patient Position: Sitting, Cuff Size: Large)   Pulse 81   Ht 5\' 8"  (1.727 m)   Wt 167 lb (75.8 kg)   BMI 25.39 kg/m  , BMI Body mass index is 25.39 kg/m. GENERAL:  Well appearing HEENT: Pupils equal round and reactive, fundi not visualized, oral mucosa unremarkable NECK:  No jugular venous distention, waveform within normal limits, carotid upstroke brisk and  symmetric, no bruits, no thyromegaly LUNGS:  Clear to auscultation bilaterally HEART:  RRR.  PMI not displaced or sustained,S1 and S2 within normal limits, no S3, no S4, no clicks, no rubs, no murmurs ABD:  Flat, positive bowel sounds normal in frequency in pitch, no bruits, no rebound, no guarding, no midline pulsatile mass, no hepatomegaly, no splenomegaly EXT:  2 plus pulses throughout, no edema, no cyanosis no clubbing SKIN:  No rashes no nodules NEURO:  Cranial nerves II through XII grossly intact, motor grossly intact throughout PSYCH:  Cognitively intact, oriented to person place and time   ASSESSMENT:    1. DOE (dyspnea on exertion)   2. CAD in native artery   3. Essential hypertension   4. Pure hypercholesterolemia    PLAN:    CAD in native artery Mild calcification noted in the LAD and RCA on coronary calcium score.  She has been feeling fatigued with exertion and has chest tightness that isn't necessarily exertional.  She also struggles with anxiety and GERD.  We will get a coronary CT-A to assess for whether CAD is contributing to her chest tightness.  Continue rosuvastatin.  LDL 71 on 09/2020.  Essential hypertension BP well-controlled on HCTZ and amlodipine.  Recommend increasing exercise to at least 150 minutes weekly.    Pure hypercholesterolemia Lipids well-controlled on rosuvastatin.      Disposition: FU with Luie Laneve C. Oval Linsey, MD, [facility] in 1 year.  Medication Adjustments/Labs and Tests Ordered: Current medicines are reviewed at length with the patient today.  Concerns regarding medicines are outlined above.   Orders Placed This Encounter  Procedures   CT CORONARY MORPH W/CTA COR W/SCORE W/CA W/CM &/OR WO/CM   Basic metabolic panel   EKG 60-VPXT    Meds ordered this encounter  Medications   metoprolol tartrate (LOPRESSOR) 100 MG tablet    Sig: TAKE 1 TABLET 2 HOURS PRIOR TO CT    Dispense:  1 tablet    Refill:  0   predniSONE (DELTASONE) 50 MG  tablet    Sig: Take 1 tablet 13 hours prior to test, then one 7 hours prior to test, and then one 1 hour prior to test    Dispense:  3 tablet    Refill:  0     Patient Instructions  Medication Instructions:  TAKE METOPROLOL 100 MG 1 TABLET 2 HOURS PRIOR TO CT   THE FOLLOWING WILL NEED TO BE TAKEN PRIOR TO CT  Prednisone 50 mg - take 13 hours prior to test Take another Prednisone 50 mg 7 hours prior to test Take another Prednisone 50 mg 1 hour prior to test Take Benadryl 50 mg 1 hour prior to test Patient must complete all four doses of above prophylactic medications. Patient will need a ride after test due to Benadryl. *If you need a refill on your cardiac medications before your next appointment, please call your pharmacy*  Lab Work: BMET 1 [place] CT   If you have labs (blood work) drawn today and your tests are completely normal, you will receive your results only by: [place] (if you have MyChart) OR A paper copy in the mail If you have any lab test that is abnormal or we need to change your treatment, we will call you to review the results.  Testing/Procedures: Your physician has requested that you have cardiac CT. Cardiac computed tomography (CT) is a painless test that uses an x-ray machine to take clear, detailed pictures of your heart. For further information please visit [email or web address].[email or web address]. Please follow instruction sheet as given. ONCE INSURANCE HAS BEEN REVIEWED THE OFFICE WILL CALL YOU TO ARRANGE   Follow-Up: At [facility], you and your health needs are our priority.  As part of our continuing mission to provide you with exceptional heart care, we have created designated Provider Care Teams.  These Care Teams include your primary Cardiologist (physician) and Advanced Practice Providers (APPs -  Physician Assistants and Nurse Practitioners) who all work together to provide you with the care you need, when you need it.  We recommend signing up  for the patient portal called "MyChart".  Sign up information is provided on this After Visit Summary.  MyChart is used to connect with patients for Virtual Visits (Telemedicine).  Patients are able to view lab/test results, encounter notes, upcoming appointments, etc.  Non-urgent messages can be sent to your provider as well.   To learn more about what you can do with MyChart, go to [email or web address].    Your next appointment:   12 month(s)  The format for your next appointment:   In Person  Provider:   Skeet Latch, MD   Other Instructions    Your cardiac CT will be scheduled at one of the below locations:   [facility] [address] [place], [place] [address] [phone]  [facility] [address] [place], [place] [address] [phone]  If scheduled at [facility], please arrive at the [facility] main entrance (entrance A) of [facility] 30 minutes prior to test start time. You can use the FREE valet parking offered at the main entrance (encouraged to control the heart rate for the test) Proceed to the [facility] Department (first floor) to check-in and test prep.  If scheduled at [facility], please arrive 15 mins early for check-in and test prep.  Please follow these instructions carefully (unless otherwise directed):  Hold all erectile dysfunction medications at least 3 days (72 hrs) prior to test.  On the Night Before the Test: Be sure to Drink plenty of water. Do not consume any caffeinated/decaffeinated beverages or chocolate 12 hours prior to your test. Do not take any antihistamines  12 hours prior to your test. If the patient has contrast allergy: Patient will need a prescription for Prednisone and very clear instructions (as follows): Prednisone 50 mg - take 13 hours prior to test Take another Prednisone 50 mg 7 hours prior to  test Take another Prednisone 50 mg 1 hour prior to test Take Benadryl 50 mg 1 hour prior to test Patient must complete all four doses of above prophylactic medications. Patient will need a ride after test due to Benadryl.  On the Day of the Test: Drink plenty of water until 1 hour prior to the test. Do not eat any food 4 hours prior to the test. You may take your regular medications prior to the test.  Take metoprolol (Lopressor) two hours prior to test. HOLD Furosemide/Hydrochlorothiazide morning of the test. FEMALES- please wear underwire-free bra if available, avoid dresses & tight clothing  After the Test: Drink plenty of water. After receiving IV contrast, you may experience a mild flushed feeling. This is normal. On occasion, you may experience a mild rash up to 24 hours after the test. This is not dangerous. If this occurs, you can take Benadryl 25 mg and increase your fluid intake. If you experience trouble breathing, this can be serious. If it is severe call 911 IMMEDIATELY. If it is mild, please call our office. If you take any of these medications: Glipizide/Metformin, Avandament, Glucavance, please do not take 48 hours after completing test unless otherwise instructed.  Please allow 2-4 weeks for scheduling of routine cardiac CTs. Some insurance companies require a pre-authorization which may delay scheduling of this test.   For non-scheduling related questions, please contact the cardiac imaging nurse navigator should you have any questions/concerns: Marchia Bond, Cardiac Imaging Nurse Navigator Gordy Clement, Cardiac Imaging Nurse Navigator [facility] Direct Office Dial: [phone]   For scheduling needs, including cancellations and rescheduling, please call [place], [phone].  Cardiac CT Angiogram A cardiac CT angiogram is a procedure to look at the heart and the area around the heart. It may be done to help find the cause of chest  pains or other symptoms of heart disease. During this procedure, a substance called contrast dye is injected into the blood vessels in the area to be checked. A large X-ray machine, called a CT scanner, then takes detailed pictures of the heart and the surrounding area. The procedure is also sometimes called a coronary CT angiogram, coronary artery scanning, or CTA. A cardiac CT angiogram allows the health care provider to see how well blood is flowing to and from the heart. The health care provider will be able to see if there are any problems, such as: Blockage or narrowing of the coronary arteries in the heart. Fluid around the heart. Signs of weakness or disease in the muscles, valves, and tissues of the heart. Tell a health care provider about: Any allergies you have. This is especially important if you have had a previous allergic reaction to contrast dye. All medicines you are taking, including vitamins, herbs, eye drops, creams, and over-the-counter medicines. Any blood disorders you have. Any surgeries you have had. Any medical conditions you have. Whether you are pregnant or may be pregnant. Any anxiety disorders, chronic pain, or other conditions you have that may increase your stress or prevent you from lying still. What are the risks? Generally, this is a safe procedure. However, problems may occur, including: Bleeding. Infection. Allergic reactions to medicines or dyes. Damage to other structures or  organs. Kidney damage from the contrast dye that is used. Increased risk of cancer from radiation exposure. This risk is low. Talk with your health care provider about: The risks and benefits of testing. How you can receive the lowest dose of radiation. What happens before the procedure? Wear comfortable clothing and remove any jewelry, glasses, dentures, and hearing aids. Follow instructions from your health care provider about eating and drinking. This may include: For 12 hours  before the procedure -- avoid caffeine. This includes tea, coffee, soda, energy drinks, and diet pills. Drink plenty of water or other fluids that do not have caffeine in them. Being well hydrated can prevent complications. For 4-6 hours before the procedure -- stop eating and drinking. The contrast dye can cause nausea, but this is less likely if your stomach is empty. Ask your health care provider about changing or stopping your regular medicines. This is especially important if you are taking diabetes medicines, blood thinners, or medicines to treat problems with erections (erectile dysfunction). What happens during the procedure?  Hair on your chest may need to be removed so that small sticky patches called electrodes can be placed on your chest. These will transmit information that helps to monitor your heart during the procedure. An IV will be inserted into one of your veins. You might be given a medicine to control your heart rate during the procedure. This will help to ensure that good images are obtained. You will be asked to lie on an exam table. This table will slide in and out of the CT machine during the procedure. Contrast dye will be injected into the IV. You might feel warm, or you may get a metallic taste in your mouth. You will be given a medicine called nitroglycerin. This will relax or dilate the arteries in your heart. The table that you are lying on will move into the CT machine tunnel for the scan. The person running the machine will give you instructions while the scans are being done. You may be asked to: Keep your arms above your head. Hold your breath. Stay very still, even if the table is moving. When the scanning is complete, you will be moved out of the machine. The IV will be removed. The procedure may vary among health care providers and hospitals. What can I expect after the procedure? After your procedure, it is common to have: A metallic taste in your mouth  from the contrast dye. A feeling of warmth. A headache from the nitroglycerin. Follow these instructions at home: Take over-the-counter and prescription medicines only as told by your health care provider. If you are told, drink enough fluid to keep your urine pale yellow. This will help to flush the contrast dye out of your body. Most people can return to their normal activities right after the procedure. Ask your health care provider what activities are safe for you. It is up to you to get the results of your procedure. Ask your health care provider, or the department that is doing the procedure, when your results will be ready. Keep all follow-up visits as told by your health care provider. This is important. Contact a health care provider if: You have any symptoms of allergy to the contrast dye. These include: Shortness of breath. Rash or hives. A racing heartbeat. Summary A cardiac CT angiogram is a procedure to look at the heart and the area around the heart. It may be done to help find the cause of chest pains  or other symptoms of heart disease. During this procedure, a large X-ray machine, called a CT scanner, takes detailed pictures of the heart and the surrounding area after a contrast dye has been injected into blood vessels in the area. Ask your health care provider about changing or stopping your regular medicines before the procedure. This is especially important if you are taking diabetes medicines, blood thinners, or medicines to treat erectile dysfunction. If you are told, drink enough fluid to keep your urine pale yellow. This will help to flush the contrast dye out of your body. This information is not intended to replace advice given to you by your health care provider. Make sure you discuss any questions you have with your health care provider. Document Revised: 10/01/2018 Document Reviewed: 10/01/2018 Elsevier Patient Education  2022 [place].     [employer]  Stumpf,acting as a Education administrator for Skeet Latch, MD.,have documented all relevant documentation on the behalf of Skeet Latch, MD,as directed by  Skeet Latch, MD while in the presence of Skeet Latch, MD.   I, [place] Oval Linsey, MD have reviewed all documentation for this visit.  The documentation of the exam, diagnosis, procedures, and orders on 12/15/2020 are all accurate and complete.   Signed, Skeet Latch, MD  12/15/2020 10:02 AM    [place]

## 2020-12-15 NOTE — Assessment & Plan Note (Signed)
Lipids well-controlled on rosuvastatin.

## 2020-12-15 NOTE — Assessment & Plan Note (Addendum)
BP well-controlled on HCTZ and amlodipine.  Recommend increasing exercise to at least 150 minutes weekly.

## 2020-12-15 NOTE — Assessment & Plan Note (Addendum)
Mild calcification noted in the LAD and RCA on coronary calcium score.  She has been feeling fatigued with exertion and has chest tightness that isn't necessarily exertional.  She also struggles with anxiety and GERD.  We will get a coronary CT-A to assess for whether CAD is contributing to her chest tightness.  Continue rosuvastatin.  LDL 71 on 09/2020.

## 2020-12-21 DIAGNOSIS — M85851 Other specified disorders of bone density and structure, right thigh: Secondary | ICD-10-CM | POA: Diagnosis not present

## 2020-12-21 DIAGNOSIS — Z1231 Encounter for screening mammogram for malignant neoplasm of breast: Secondary | ICD-10-CM | POA: Diagnosis not present

## 2020-12-21 DIAGNOSIS — M85852 Other specified disorders of bone density and structure, left thigh: Secondary | ICD-10-CM | POA: Diagnosis not present

## 2020-12-21 DIAGNOSIS — Z780 Asymptomatic menopausal state: Secondary | ICD-10-CM | POA: Diagnosis not present

## 2020-12-23 DIAGNOSIS — I10 Essential (primary) hypertension: Secondary | ICD-10-CM | POA: Diagnosis not present

## 2020-12-23 DIAGNOSIS — R0609 Other forms of dyspnea: Secondary | ICD-10-CM | POA: Diagnosis not present

## 2020-12-23 LAB — BASIC METABOLIC PANEL
BUN/Creatinine Ratio: 16 (ref 12–28)
BUN: 11 mg/dL (ref 8–27)
CO2: 26 mmol/L (ref 20–29)
Calcium: 9.9 mg/dL (ref 8.7–10.3)
Chloride: 100 mmol/L (ref 96–106)
Creatinine, Ser: 0.67 mg/dL (ref 0.57–1.00)
Glucose: 104 mg/dL — ABNORMAL HIGH (ref 70–99)
Potassium: 4.4 mmol/L (ref 3.5–5.2)
Sodium: 139 mmol/L (ref 134–144)
eGFR: 96 mL/min/{1.73_m2} (ref >59)

## 2020-12-26 ENCOUNTER — Telehealth (HOSPITAL_COMMUNITY): Payer: Self-pay | Admitting: *Deleted

## 2020-12-26 NOTE — Telephone Encounter (Signed)
Returning patient's regarding upcoming cardiac imaging study; pt verbalizes understanding of appt date/time, but wishes to cancel her appointment.  She states she is feeling better and her symptoms have subsided. She no longer wishes to pursue testing at this time.  Patient's appointment canceled and will let Dr Oval Linsey and scheduler know.  Gordy Clement RN Navigator Cardiac Imaging [facility] [phone] office [phone] cell

## 2020-12-29 ENCOUNTER — Ambulatory Visit (HOSPITAL_COMMUNITY): Payer: Medicare Other

## 2020-12-30 ENCOUNTER — Ambulatory Visit (HOSPITAL_COMMUNITY): Payer: Medicare Other

## 2021-01-18 DIAGNOSIS — Z0000 Encounter for general adult medical examination without abnormal findings: Secondary | ICD-10-CM | POA: Diagnosis not present

## 2021-01-18 DIAGNOSIS — L209 Atopic dermatitis, unspecified: Secondary | ICD-10-CM | POA: Diagnosis not present

## 2021-01-18 DIAGNOSIS — R7301 Impaired fasting glucose: Secondary | ICD-10-CM | POA: Diagnosis not present

## 2021-01-18 DIAGNOSIS — I10 Essential (primary) hypertension: Secondary | ICD-10-CM | POA: Diagnosis not present

## 2021-01-18 DIAGNOSIS — E785 Hyperlipidemia, unspecified: Secondary | ICD-10-CM | POA: Diagnosis not present

## 2021-02-23 DIAGNOSIS — J4520 Mild intermittent asthma, uncomplicated: Secondary | ICD-10-CM | POA: Diagnosis not present

## 2021-03-21 DIAGNOSIS — J4520 Mild intermittent asthma, uncomplicated: Secondary | ICD-10-CM | POA: Diagnosis not present

## 2021-03-21 DIAGNOSIS — K219 Gastro-esophageal reflux disease without esophagitis: Secondary | ICD-10-CM | POA: Diagnosis not present

## 2021-03-21 DIAGNOSIS — I2510 Atherosclerotic heart disease of native coronary artery without angina pectoris: Secondary | ICD-10-CM | POA: Diagnosis not present

## 2021-03-21 DIAGNOSIS — E785 Hyperlipidemia, unspecified: Secondary | ICD-10-CM | POA: Diagnosis not present

## 2021-03-21 DIAGNOSIS — M8580 Other specified disorders of bone density and structure, unspecified site: Secondary | ICD-10-CM | POA: Diagnosis not present

## 2021-03-21 DIAGNOSIS — I10 Essential (primary) hypertension: Secondary | ICD-10-CM | POA: Diagnosis not present

## 2021-03-24 DIAGNOSIS — L811 Chloasma: Secondary | ICD-10-CM | POA: Diagnosis not present

## 2021-03-24 DIAGNOSIS — L810 Postinflammatory hyperpigmentation: Secondary | ICD-10-CM | POA: Diagnosis not present

## 2021-03-24 DIAGNOSIS — L280 Lichen simplex chronicus: Secondary | ICD-10-CM | POA: Diagnosis not present

## 2021-03-24 DIAGNOSIS — D2371 Other benign neoplasm of skin of right lower limb, including hip: Secondary | ICD-10-CM | POA: Diagnosis not present

## 2021-03-24 DIAGNOSIS — L821 Other seborrheic keratosis: Secondary | ICD-10-CM | POA: Diagnosis not present

## 2021-03-24 DIAGNOSIS — L668 Other cicatricial alopecia: Secondary | ICD-10-CM | POA: Diagnosis not present

## 2021-03-24 DIAGNOSIS — L718 Other rosacea: Secondary | ICD-10-CM | POA: Diagnosis not present

## 2021-03-24 DIAGNOSIS — L731 Pseudofolliculitis barbae: Secondary | ICD-10-CM | POA: Diagnosis not present

## 2021-04-05 DIAGNOSIS — I10 Essential (primary) hypertension: Secondary | ICD-10-CM | POA: Diagnosis not present

## 2021-04-05 DIAGNOSIS — H2513 Age-related nuclear cataract, bilateral: Secondary | ICD-10-CM | POA: Diagnosis not present

## 2021-04-05 DIAGNOSIS — H40013 Open angle with borderline findings, low risk, bilateral: Secondary | ICD-10-CM | POA: Diagnosis not present

## 2021-04-05 DIAGNOSIS — H16223 Keratoconjunctivitis sicca, not specified as Sjogren's, bilateral: Secondary | ICD-10-CM | POA: Diagnosis not present

## 2021-04-05 DIAGNOSIS — H35033 Hypertensive retinopathy, bilateral: Secondary | ICD-10-CM | POA: Diagnosis not present

## 2021-04-05 DIAGNOSIS — H524 Presbyopia: Secondary | ICD-10-CM | POA: Diagnosis not present

## 2021-06-08 DIAGNOSIS — I10 Essential (primary) hypertension: Secondary | ICD-10-CM | POA: Diagnosis not present

## 2021-06-08 DIAGNOSIS — M8580 Other specified disorders of bone density and structure, unspecified site: Secondary | ICD-10-CM | POA: Diagnosis not present

## 2021-06-08 DIAGNOSIS — K219 Gastro-esophageal reflux disease without esophagitis: Secondary | ICD-10-CM | POA: Diagnosis not present

## 2021-06-08 DIAGNOSIS — E785 Hyperlipidemia, unspecified: Secondary | ICD-10-CM | POA: Diagnosis not present

## 2021-06-08 DIAGNOSIS — I2510 Atherosclerotic heart disease of native coronary artery without angina pectoris: Secondary | ICD-10-CM | POA: Diagnosis not present

## 2021-06-21 DIAGNOSIS — J0100 Acute maxillary sinusitis, unspecified: Secondary | ICD-10-CM | POA: Diagnosis not present

## 2021-07-18 ENCOUNTER — Other Ambulatory Visit: Payer: Self-pay | Admitting: Family Medicine

## 2021-07-18 DIAGNOSIS — R5383 Other fatigue: Secondary | ICD-10-CM | POA: Diagnosis not present

## 2021-07-18 DIAGNOSIS — J302 Other seasonal allergic rhinitis: Secondary | ICD-10-CM | POA: Diagnosis not present

## 2021-07-18 DIAGNOSIS — R252 Cramp and spasm: Secondary | ICD-10-CM

## 2021-07-21 ENCOUNTER — Ambulatory Visit
Admission: RE | Admit: 2021-07-21 | Discharge: 2021-07-21 | Disposition: A | Discharge status: Home / Self Care | Payer: Medicare Other | Source: Ambulatory Visit | Attending: Family Medicine | Admitting: Family Medicine

## 2021-07-21 DIAGNOSIS — R252 Cramp and spasm: Secondary | ICD-10-CM

## 2021-07-21 DIAGNOSIS — M79661 Pain in right lower leg: Secondary | ICD-10-CM | POA: Diagnosis not present

## 2021-07-21 DIAGNOSIS — M79662 Pain in left lower leg: Secondary | ICD-10-CM | POA: Diagnosis not present

## 2021-09-21 DIAGNOSIS — R0981 Nasal congestion: Secondary | ICD-10-CM | POA: Diagnosis not present

## 2021-09-21 DIAGNOSIS — U071 COVID-19: Secondary | ICD-10-CM | POA: Diagnosis not present

## 2021-09-26 DIAGNOSIS — J0110 Acute frontal sinusitis, unspecified: Secondary | ICD-10-CM | POA: Diagnosis not present

## 2021-10-03 DIAGNOSIS — L304 Erythema intertrigo: Secondary | ICD-10-CM | POA: Diagnosis not present

## 2021-10-03 DIAGNOSIS — D2371 Other benign neoplasm of skin of right lower limb, including hip: Secondary | ICD-10-CM | POA: Diagnosis not present

## 2021-10-03 DIAGNOSIS — L209 Atopic dermatitis, unspecified: Secondary | ICD-10-CM | POA: Diagnosis not present

## 2021-10-03 DIAGNOSIS — L821 Other seborrheic keratosis: Secondary | ICD-10-CM | POA: Diagnosis not present

## 2021-10-03 DIAGNOSIS — L668 Other cicatricial alopecia: Secondary | ICD-10-CM | POA: Diagnosis not present

## 2021-10-03 DIAGNOSIS — L218 Other seborrheic dermatitis: Secondary | ICD-10-CM | POA: Diagnosis not present

## 2021-10-03 DIAGNOSIS — L718 Other rosacea: Secondary | ICD-10-CM | POA: Diagnosis not present

## 2021-10-03 DIAGNOSIS — L815 Leukoderma, not elsewhere classified: Secondary | ICD-10-CM | POA: Diagnosis not present

## 2021-10-04 DIAGNOSIS — R051 Acute cough: Secondary | ICD-10-CM | POA: Diagnosis not present

## 2021-10-08 DIAGNOSIS — I2510 Atherosclerotic heart disease of native coronary artery without angina pectoris: Secondary | ICD-10-CM | POA: Diagnosis not present

## 2021-10-08 DIAGNOSIS — I10 Essential (primary) hypertension: Secondary | ICD-10-CM | POA: Diagnosis not present

## 2021-10-08 DIAGNOSIS — K219 Gastro-esophageal reflux disease without esophagitis: Secondary | ICD-10-CM | POA: Diagnosis not present

## 2021-10-08 DIAGNOSIS — E785 Hyperlipidemia, unspecified: Secondary | ICD-10-CM | POA: Diagnosis not present

## 2021-10-12 DIAGNOSIS — R0602 Shortness of breath: Secondary | ICD-10-CM | POA: Diagnosis not present

## 2021-10-12 DIAGNOSIS — I10 Essential (primary) hypertension: Secondary | ICD-10-CM | POA: Diagnosis not present

## 2021-10-30 DIAGNOSIS — H2513 Age-related nuclear cataract, bilateral: Secondary | ICD-10-CM | POA: Diagnosis not present

## 2021-10-30 DIAGNOSIS — H16223 Keratoconjunctivitis sicca, not specified as Sjogren's, bilateral: Secondary | ICD-10-CM | POA: Diagnosis not present

## 2021-10-30 DIAGNOSIS — H524 Presbyopia: Secondary | ICD-10-CM | POA: Diagnosis not present

## 2021-10-30 DIAGNOSIS — H40013 Open angle with borderline findings, low risk, bilateral: Secondary | ICD-10-CM | POA: Diagnosis not present

## 2021-12-26 DIAGNOSIS — Z1231 Encounter for screening mammogram for malignant neoplasm of breast: Secondary | ICD-10-CM | POA: Diagnosis not present

## 2022-01-26 DIAGNOSIS — I2510 Atherosclerotic heart disease of native coronary artery without angina pectoris: Secondary | ICD-10-CM | POA: Diagnosis not present

## 2022-01-26 DIAGNOSIS — Z0000 Encounter for general adult medical examination without abnormal findings: Secondary | ICD-10-CM | POA: Diagnosis not present

## 2022-01-26 DIAGNOSIS — R7301 Impaired fasting glucose: Secondary | ICD-10-CM | POA: Diagnosis not present

## 2022-01-26 DIAGNOSIS — H6123 Impacted cerumen, bilateral: Secondary | ICD-10-CM | POA: Diagnosis not present

## 2022-01-26 DIAGNOSIS — E785 Hyperlipidemia, unspecified: Secondary | ICD-10-CM | POA: Diagnosis not present

## 2022-01-26 DIAGNOSIS — R5383 Other fatigue: Secondary | ICD-10-CM | POA: Diagnosis not present

## 2022-01-26 DIAGNOSIS — I10 Essential (primary) hypertension: Secondary | ICD-10-CM | POA: Diagnosis not present

## 2022-02-16 DIAGNOSIS — I2510 Atherosclerotic heart disease of native coronary artery without angina pectoris: Secondary | ICD-10-CM | POA: Diagnosis not present

## 2022-02-16 DIAGNOSIS — E785 Hyperlipidemia, unspecified: Secondary | ICD-10-CM | POA: Diagnosis not present

## 2022-02-16 DIAGNOSIS — K219 Gastro-esophageal reflux disease without esophagitis: Secondary | ICD-10-CM | POA: Diagnosis not present

## 2022-02-16 DIAGNOSIS — I10 Essential (primary) hypertension: Secondary | ICD-10-CM | POA: Diagnosis not present

## 2022-03-12 ENCOUNTER — Encounter (HOSPITAL_BASED_OUTPATIENT_CLINIC_OR_DEPARTMENT_OTHER): Payer: Self-pay

## 2022-03-12 ENCOUNTER — Other Ambulatory Visit: Payer: Self-pay

## 2022-03-12 DIAGNOSIS — I2510 Atherosclerotic heart disease of native coronary artery without angina pectoris: Secondary | ICD-10-CM | POA: Insufficient documentation

## 2022-03-12 DIAGNOSIS — M5442 Lumbago with sciatica, left side: Secondary | ICD-10-CM | POA: Diagnosis not present

## 2022-03-12 DIAGNOSIS — I10 Essential (primary) hypertension: Secondary | ICD-10-CM | POA: Diagnosis not present

## 2022-03-12 DIAGNOSIS — M5432 Sciatica, left side: Secondary | ICD-10-CM | POA: Insufficient documentation

## 2022-03-12 DIAGNOSIS — Z79899 Other long term (current) drug therapy: Secondary | ICD-10-CM | POA: Insufficient documentation

## 2022-03-12 DIAGNOSIS — M549 Dorsalgia, unspecified: Secondary | ICD-10-CM | POA: Diagnosis present

## 2022-03-12 NOTE — ED Triage Notes (Signed)
Pt has hx of sciatica and states she lifted something 2 days ago and pain began.  Left lower back and left leg

## 2022-03-13 ENCOUNTER — Emergency Department (HOSPITAL_BASED_OUTPATIENT_CLINIC_OR_DEPARTMENT_OTHER)
Admission: EM | Admit: 2022-03-13 | Discharge: 2022-03-13 | Disposition: A | Discharge status: Home / Self Care | Payer: Medicare Other | Attending: Emergency Medicine | Admitting: Emergency Medicine

## 2022-03-13 ENCOUNTER — Emergency Department (HOSPITAL_BASED_OUTPATIENT_CLINIC_OR_DEPARTMENT_OTHER): Payer: Medicare Other | Admitting: Radiology

## 2022-03-13 DIAGNOSIS — M5432 Sciatica, left side: Secondary | ICD-10-CM

## 2022-03-13 DIAGNOSIS — M545 Low back pain, unspecified: Secondary | ICD-10-CM | POA: Diagnosis not present

## 2022-03-13 MED ORDER — LIDOCAINE 5 % EX PTCH
1.0000 | MEDICATED_PATCH | CUTANEOUS | Status: DC
  Administered 2022-03-13: 1 via TRANSDERMAL
  Filled 2022-03-13: qty 1

## 2022-03-13 MED ORDER — OXYCODONE-ACETAMINOPHEN 5-325 MG PO TABS
1.0000 | ORAL_TABLET | Freq: Once | ORAL | Status: AC
  Administered 2022-03-13: 1 via ORAL
  Filled 2022-03-13: qty 1

## 2022-03-13 MED ORDER — OXYCODONE-ACETAMINOPHEN 5-325 MG PO TABS: 1.0000 | ORAL_TABLET | Freq: Four times a day (QID) | ORAL | 0 refills | Status: DC | PRN

## 2022-03-13 MED ORDER — METHYLPREDNISOLONE 4 MG PO TBPK: ORAL_TABLET | ORAL | 0 refills | Status: DC

## 2022-03-13 MED ORDER — DEXAMETHASONE SODIUM PHOSPHATE 10 MG/ML IJ SOLN
10.0000 mg | Freq: Once | INTRAMUSCULAR | Status: AC
  Administered 2022-03-13: 10 mg via INTRAMUSCULAR
  Filled 2022-03-13: qty 1

## 2022-03-13 MED ORDER — LIDOCAINE 5 % EX PTCH: 1.0000 | MEDICATED_PATCH | CUTANEOUS | 0 refills | Status: AC

## 2022-03-13 NOTE — ED Notes (Signed)
Patient and daughter verbalized understanding of discharge instructions and reasons to return to the ED.

## 2022-03-13 NOTE — Discharge Instructions (Signed)
You were seen today for your ongoing sciatic symptoms.  Take medications as prescribed.  Follow-up with your primary doctor if not improving as you may benefit from physical therapy.  If you develop any new or worsening symptoms, you should be reevaluated.

## 2022-03-13 NOTE — ED Provider Notes (Signed)
[facility] Provider Note   CSN: [id number] Arrival date & time: 03/12/22  2212     History  Chief Complaint  Patient presents with   Back Pain    Tinleigh Whitmire is a 67 y.o. female.  HPI     This is a 67 year old female with a history of sciatica who presents with back pain.  Patient reports that she has a history of sciatica which she deals with occasionally.  She was visiting her son in [place] and slipped and fell.  She states she may have injured her back at that time but felt fine immediately after the fall.  She subsequently did some heavy lifting on Friday and has since had some midline back pain that radiates into the left buttock and left leg.  It is worse with certain movements.  She has been using Tylenol and heat and ice with minimal relief.  She does not tolerate NSAIDs.  She reports that she is now walking with pain.  She is unsure whether she has true weakness or she just has significant pain and inhibiting her gait.  Denies bowel or bladder difficulties.  Denies numbness or tingling. Home Medications Prior to Admission medications   Medication Sig Start Date End Date Taking? Authorizing Provider  lidocaine (LIDODERM) 5 % Place 1 patch onto the skin daily. Remove & Discard patch within 12 hours or as directed by MD 03/13/22  Yes Owyn Raulston, Barbette Hair, MD  methylPREDNISolone (MEDROL DOSEPAK) 4 MG TBPK tablet Take as directed on packet. 03/13/22  Yes Malvin Morrish, Barbette Hair, MD  oxyCODONE-acetaminophen (PERCOCET/ROXICET) 5-325 MG tablet Take 1 tablet by mouth every 6 (six) hours as needed for severe pain. 03/13/22  Yes Samaa Ueda, Barbette Hair, MD  amLODipine (NORVASC) 10 MG tablet Take 10 mg by mouth daily.    [provider]  Ascorbic Acid 500 MG CHEW Vitamin C 500 mg chewable tablet  Take by oral route.    [provider]  celecoxib (CELEBREX) 200 MG capsule Take 200 mg by mouth as needed. 01/13/20   [provider]  cetirizine (ZYRTEC) 10 MG chewable tablet Chew 10 mg by mouth daily.    [provider]  Cetirizine HCl (ZYRTEC ALLERGY) 10 MG CAPS Take 10 mg by mouth as needed.    [provider]  Cholecalciferol 50 MCG (2000 UT) CAPS Vitamin D3 50 mcg (2,000 unit) capsule  Take by oral route.    [provider]  clindamycin (CLEOCIN T) 1 % lotion clindamycin 1 % lotion    [provider]  famotidine (PEPCID) 40 MG tablet Take 40 mg by mouth daily.    [provider]  fluticasone (FLONASE) 50 MCG/ACT nasal spray Place into both nostrils daily.    [provider]  fluticasone furoate-vilanterol (BREO ELLIPTA) 100-25 MCG/ACT AEPB SMARTSIG:1 Puff(s) Via Inhaler Daily PRN 10/04/20   [provider]  hydrochlorothiazide (HYDRODIURIL) 25 MG tablet Take 25 mg by mouth every morning. 03/29/20   [provider]  LORazepam (ATIVAN) 1 MG tablet Take 1 tablet (1 mg total) by mouth every 8 (eight) hours as needed (muscle spasms). 03/12/20   Molpus, John, MD  metoprolol tartrate (LOPRESSOR) 100 MG tablet TAKE 1 TABLET 2 HOURS PRIOR TO CT 12/15/20   Skeet Latch, MD  metroNIDAZOLE (METROCREAM) 0.75 % cream SMARTSIG:1 Topical Every Night 01/28/20   [provider]  Multiple Vitamin (MULTIVITAMIN ADULT PO) multivitamin    [provider]  naproxen (NAPROSYN) 500  MG tablet Take 1 tablet (500 mg total) by mouth 2 (two) times daily with a meal. 03/19/20   Noemi Chapel, MD  nystatin-triamcinolone [facility] II) cream Apply topically. 11/25/19   [provider]  pantoprazole (PROTONIX) 40 MG tablet Take 40 mg by mouth daily. 11/25/20   [provider]  predniSONE (DELTASONE) 50 MG tablet Take 1 tablet 13 hours prior to test, then one 7 hours prior to test, and then one 1 hour prior to test 12/15/20   Skeet Latch, MD  Probiotic Product (PROBIOTIC-10 PO) Breo Ellipta 100 mcg-25 mcg/dose powder for inhalation   INHALE 1 PUFF BY MOUTH EVERY DAY    [provider]  rosuvastatin (CRESTOR) 10 MG tablet Take 10 mg by mouth daily. 10/09/20   [provider]      Allergies    Contrast media [iodinated contrast media] and Penicillins    Review of Systems   Review of Systems  Constitutional:  Negative for fever.  Musculoskeletal:  Positive for back pain.  Neurological:  Negative for weakness and numbness.  All other systems reviewed and are negative.   Physical Exam Updated Vital Signs BP (!) 177/106   Pulse (!) 104   Temp 98 F (36.7 C)   Resp 18   Ht 1.727 m ('5\' 8"'$ )   Wt 73.9 kg   SpO2 97%   BMI 24.78 kg/m  Physical Exam Vitals and nursing note reviewed.  Constitutional:      Appearance: She is well-developed. She is not ill-appearing.  HENT:     Head: Normocephalic and atraumatic.  Eyes:     Pupils: Pupils are equal, round, and reactive to light.  Cardiovascular:     Rate and Rhythm: Normal rate and regular rhythm.     Heart sounds: Normal heart sounds.  Pulmonary:     Effort: Pulmonary effort is normal. No respiratory distress.     Breath sounds: No wheezing.  Abdominal:     Palpations: Abdomen is soft.  Musculoskeletal:     Cervical back: Neck supple.     Comments: Midline lower lumbar tenderness palpation, no step-off or deformity noted, positive left straight leg raise  Skin:    General: Skin is warm and dry.  Neurological:     Mental Status: She is alert and oriented to person, place, and time.     Comments: 5 out of 5 strength bilateral lower extremities, no clonus, equal patellar reflexes bilaterally  Psychiatric:        Mood and Affect: Mood normal.     ED Results / Procedures / Treatments   Labs (all labs ordered are listed, but only abnormal results are displayed) Labs Reviewed - No data to display  EKG None  Radiology DG Lumbar Spine Complete  Result Date: 03/13/2022 CLINICAL DATA:  Fall, back pain, sciatica EXAM: LUMBAR SPINE -  COMPLETE 4+ VIEW COMPARISON:  None Available. FINDINGS: Five non rib bearing segments of the lumbar spine. Normal lumbar lordosis. No acute fracture or listhesis of the lumbar spine. Intervertebral disc space narrowing and endplate remodeling at S8-N4 is in keeping with mild degenerative disc disease. Minimal degenerative changes noted at L1-2. Remaining intervertebral disc heights are preserved. Paraspinal soft tissues are unremarkable. IMPRESSION: 1. Mild degenerative disc disease. No acute fracture or listhesis. Electronically Signed   By: Fidela Salisbury M.D.   On: 03/13/2022 03:06    Procedures Procedures    Medications Ordered in ED Medications  lidocaine (LIDODERM) 5 % 1 patch (1 patch Transdermal  Patch Applied 03/13/22 0150)  oxyCODONE-acetaminophen (PERCOCET/ROXICET) 5-325 MG per tablet 1 tablet (1 tablet Oral Given 03/13/22 0151)  dexamethasone (DECADRON) injection 10 mg (10 mg Intramuscular Given 03/13/22 0151)    ED Course/ Medical Decision Making/ A&P                             Medical Decision Making Amount and/or Complexity of Data Reviewed Radiology: ordered.  Risk Prescription drug management.   This patient presents to the ED for concern of back pain, this involves an extensive number of treatment options, and is a complaint that carries with it a high risk of complications and morbidity.  I considered the following differential and admission for this acute, potentially life threatening condition.  The differential diagnosis includes new fracture or sprain, sciatica  MDM:    This is a 67 year old female who presents with worsening back pain.  Known history of sciatica.  States that it feels the same.  She did recently have a fall.  She is neurovascularly intact.  Exam is consistent with sciatic type symptoms without neurologic deficit.  No signs or symptoms of cauda equina.  X-rays were obtained given recent fall.  X-rays negative for acute fracture.  Patient was given  Decadron, Lidoderm, Percocet.  On recheck, she states she feels much better.  Patient is intolerant to NSAIDs.  Will start on a Medrol Dosepak and give a short course of Percocet and Lidoderm.  Recommend follow-up with PCP.  She was notably hypertensive.  She takes blood pressure medications at home and this is in the setting of acute pain.  Recommend recheck by PCP.  (Labs, imaging, consults)  Labs: I Ordered, and personally interpreted labs.  The pertinent results include: None  Imaging Studies ordered: I ordered imaging studies including lumbar x-rays I independently visualized and interpreted imaging. I agree with the radiologist interpretation  Additional history obtained from chart review.  External records from outside source obtained and reviewed including prior evaluations  Cardiac Monitoring: The patient was maintained on a cardiac monitor.  I personally viewed and interpreted the cardiac monitored which showed an underlying rhythm of: Sinus rhythm  Reevaluation: After the interventions noted above, I reevaluated the patient and found that they have :improved  Social Determinants of Health:  lives independently  Disposition: Discharge  Co morbidities that complicate the patient evaluation  Past Medical History:  Diagnosis Date   CAD in native artery 12/15/2020   Constipation    Essential hypertension 12/15/2020   GERD (gastroesophageal reflux disease)    Hypertension    Pure hypercholesterolemia 12/15/2020   Sciatica      Medicines Meds ordered this encounter  Medications   oxyCODONE-acetaminophen (PERCOCET/ROXICET) 5-325 MG per tablet 1 tablet   dexamethasone (DECADRON) injection 10 mg   lidocaine (LIDODERM) 5 % 1 patch   methylPREDNISolone (MEDROL DOSEPAK) 4 MG TBPK tablet    Sig: Take as directed on packet.    Dispense:  21 tablet    Refill:  0   oxyCODONE-acetaminophen (PERCOCET/ROXICET) 5-325 MG tablet    Sig: Take 1 tablet by mouth every 6 (six) hours  as needed for severe pain.    Dispense:  10 tablet    Refill:  0   lidocaine (LIDODERM) 5 %    Sig: Place 1 patch onto the skin daily. Remove & Discard patch within 12 hours or as directed by MD    Dispense:  15 patch  Refill:  0    I have reviewed the patients home medicines and have made adjustments as needed  Problem List / ED Course: Problem List Items Addressed This Visit   None Visit Diagnoses     Sciatica of left side    -  Primary                   Final Clinical Impression(s) / ED Diagnoses Final diagnoses:  Sciatica of left side    Rx / DC Orders ED Discharge Orders          Ordered    methylPREDNISolone (MEDROL DOSEPAK) 4 MG TBPK tablet        03/13/22 0319    oxyCODONE-acetaminophen (PERCOCET/ROXICET) 5-325 MG tablet  Every 6 hours PRN        03/13/22 0319    lidocaine (LIDODERM) 5 %  Every 24 hours        03/13/22 0319              Merryl Hacker, MD 03/13/22 [phone]

## 2022-03-15 DIAGNOSIS — E785 Hyperlipidemia, unspecified: Secondary | ICD-10-CM | POA: Diagnosis not present

## 2022-03-15 DIAGNOSIS — I2510 Atherosclerotic heart disease of native coronary artery without angina pectoris: Secondary | ICD-10-CM | POA: Diagnosis not present

## 2022-03-15 DIAGNOSIS — K219 Gastro-esophageal reflux disease without esophagitis: Secondary | ICD-10-CM | POA: Diagnosis not present

## 2022-03-15 DIAGNOSIS — I10 Essential (primary) hypertension: Secondary | ICD-10-CM | POA: Diagnosis not present

## 2022-03-21 DIAGNOSIS — M25552 Pain in left hip: Secondary | ICD-10-CM | POA: Diagnosis not present

## 2022-03-27 DIAGNOSIS — M5451 Vertebrogenic low back pain: Secondary | ICD-10-CM | POA: Diagnosis not present

## 2022-04-02 DIAGNOSIS — M5416 Radiculopathy, lumbar region: Secondary | ICD-10-CM | POA: Diagnosis not present

## 2022-04-24 DIAGNOSIS — M5451 Vertebrogenic low back pain: Secondary | ICD-10-CM | POA: Diagnosis not present

## 2022-05-08 DIAGNOSIS — J209 Acute bronchitis, unspecified: Secondary | ICD-10-CM | POA: Diagnosis not present

## 2022-05-08 DIAGNOSIS — R051 Acute cough: Secondary | ICD-10-CM | POA: Diagnosis not present

## 2022-05-08 DIAGNOSIS — J069 Acute upper respiratory infection, unspecified: Secondary | ICD-10-CM | POA: Diagnosis not present

## 2022-05-08 DIAGNOSIS — Z03818 Encounter for observation for suspected exposure to other biological agents ruled out: Secondary | ICD-10-CM | POA: Diagnosis not present

## 2022-05-08 DIAGNOSIS — U071 COVID-19: Secondary | ICD-10-CM | POA: Diagnosis not present

## 2022-05-17 DIAGNOSIS — H2513 Age-related nuclear cataract, bilateral: Secondary | ICD-10-CM | POA: Diagnosis not present

## 2022-06-07 DIAGNOSIS — M5459 Other low back pain: Secondary | ICD-10-CM | POA: Diagnosis not present

## 2022-06-21 DIAGNOSIS — M5459 Other low back pain: Secondary | ICD-10-CM | POA: Diagnosis not present

## 2022-06-26 DIAGNOSIS — L304 Erythema intertrigo: Secondary | ICD-10-CM | POA: Diagnosis not present

## 2022-06-26 DIAGNOSIS — L810 Postinflammatory hyperpigmentation: Secondary | ICD-10-CM | POA: Diagnosis not present

## 2022-06-26 DIAGNOSIS — L209 Atopic dermatitis, unspecified: Secondary | ICD-10-CM | POA: Diagnosis not present

## 2022-06-26 DIAGNOSIS — L718 Other rosacea: Secondary | ICD-10-CM | POA: Diagnosis not present

## 2022-06-26 DIAGNOSIS — L668 Other cicatricial alopecia: Secondary | ICD-10-CM | POA: Diagnosis not present

## 2022-06-26 DIAGNOSIS — L218 Other seborrheic dermatitis: Secondary | ICD-10-CM | POA: Diagnosis not present

## 2022-07-06 DIAGNOSIS — M5459 Other low back pain: Secondary | ICD-10-CM | POA: Diagnosis not present

## 2022-07-31 DIAGNOSIS — M5440 Lumbago with sciatica, unspecified side: Secondary | ICD-10-CM | POA: Diagnosis not present

## 2022-07-31 DIAGNOSIS — I10 Essential (primary) hypertension: Secondary | ICD-10-CM | POA: Diagnosis not present

## 2022-07-31 DIAGNOSIS — E785 Hyperlipidemia, unspecified: Secondary | ICD-10-CM | POA: Diagnosis not present

## 2022-07-31 DIAGNOSIS — K219 Gastro-esophageal reflux disease without esophagitis: Secondary | ICD-10-CM | POA: Diagnosis not present

## 2022-07-31 DIAGNOSIS — J4520 Mild intermittent asthma, uncomplicated: Secondary | ICD-10-CM | POA: Diagnosis not present

## 2022-07-31 DIAGNOSIS — I2510 Atherosclerotic heart disease of native coronary artery without angina pectoris: Secondary | ICD-10-CM | POA: Diagnosis not present

## 2022-08-02 DIAGNOSIS — M5459 Other low back pain: Secondary | ICD-10-CM | POA: Diagnosis not present

## 2022-10-04 DIAGNOSIS — D225 Melanocytic nevi of trunk: Secondary | ICD-10-CM | POA: Diagnosis not present

## 2022-10-04 DIAGNOSIS — L209 Atopic dermatitis, unspecified: Secondary | ICD-10-CM | POA: Diagnosis not present

## 2022-10-04 DIAGNOSIS — L718 Other rosacea: Secondary | ICD-10-CM | POA: Diagnosis not present

## 2022-10-04 DIAGNOSIS — L821 Other seborrheic keratosis: Secondary | ICD-10-CM | POA: Diagnosis not present

## 2022-10-04 DIAGNOSIS — L218 Other seborrheic dermatitis: Secondary | ICD-10-CM | POA: Diagnosis not present

## 2022-10-04 DIAGNOSIS — L810 Postinflammatory hyperpigmentation: Secondary | ICD-10-CM | POA: Diagnosis not present

## 2022-10-04 DIAGNOSIS — L608 Other nail disorders: Secondary | ICD-10-CM | POA: Diagnosis not present

## 2022-10-26 DIAGNOSIS — M170 Bilateral primary osteoarthritis of knee: Secondary | ICD-10-CM | POA: Diagnosis not present

## 2022-10-30 DIAGNOSIS — Z23 Encounter for immunization: Secondary | ICD-10-CM | POA: Diagnosis not present

## 2022-11-07 DIAGNOSIS — M25562 Pain in left knee: Secondary | ICD-10-CM | POA: Diagnosis not present

## 2022-11-07 DIAGNOSIS — M25561 Pain in right knee: Secondary | ICD-10-CM | POA: Diagnosis not present

## 2022-11-21 DIAGNOSIS — M25561 Pain in right knee: Secondary | ICD-10-CM | POA: Diagnosis not present

## 2022-11-21 DIAGNOSIS — M25562 Pain in left knee: Secondary | ICD-10-CM | POA: Diagnosis not present

## 2022-11-28 DIAGNOSIS — M25561 Pain in right knee: Secondary | ICD-10-CM | POA: Diagnosis not present

## 2022-11-28 DIAGNOSIS — M25562 Pain in left knee: Secondary | ICD-10-CM | POA: Diagnosis not present

## 2022-12-05 DIAGNOSIS — M25561 Pain in right knee: Secondary | ICD-10-CM | POA: Diagnosis not present

## 2022-12-05 DIAGNOSIS — M25562 Pain in left knee: Secondary | ICD-10-CM | POA: Diagnosis not present

## 2022-12-12 DIAGNOSIS — M25562 Pain in left knee: Secondary | ICD-10-CM | POA: Diagnosis not present

## 2022-12-12 DIAGNOSIS — M25561 Pain in right knee: Secondary | ICD-10-CM | POA: Diagnosis not present

## 2023-01-01 DIAGNOSIS — Z1231 Encounter for screening mammogram for malignant neoplasm of breast: Secondary | ICD-10-CM | POA: Diagnosis not present

## 2023-02-05 DIAGNOSIS — E785 Hyperlipidemia, unspecified: Secondary | ICD-10-CM | POA: Diagnosis not present

## 2023-02-05 DIAGNOSIS — R7301 Impaired fasting glucose: Secondary | ICD-10-CM | POA: Diagnosis not present

## 2023-02-05 DIAGNOSIS — Z9181 History of falling: Secondary | ICD-10-CM | POA: Diagnosis not present

## 2023-02-05 DIAGNOSIS — R002 Palpitations: Secondary | ICD-10-CM | POA: Diagnosis not present

## 2023-02-05 DIAGNOSIS — N390 Urinary tract infection, site not specified: Secondary | ICD-10-CM | POA: Diagnosis not present

## 2023-02-05 DIAGNOSIS — Z0000 Encounter for general adult medical examination without abnormal findings: Secondary | ICD-10-CM | POA: Diagnosis not present

## 2023-02-05 DIAGNOSIS — I2510 Atherosclerotic heart disease of native coronary artery without angina pectoris: Secondary | ICD-10-CM | POA: Diagnosis not present

## 2023-02-05 DIAGNOSIS — I10 Essential (primary) hypertension: Secondary | ICD-10-CM | POA: Diagnosis not present

## 2023-02-05 DIAGNOSIS — K219 Gastro-esophageal reflux disease without esophagitis: Secondary | ICD-10-CM | POA: Diagnosis not present

## 2023-02-05 DIAGNOSIS — J4520 Mild intermittent asthma, uncomplicated: Secondary | ICD-10-CM | POA: Diagnosis not present

## 2023-02-15 DIAGNOSIS — I10 Essential (primary) hypertension: Secondary | ICD-10-CM | POA: Diagnosis not present

## 2023-02-15 DIAGNOSIS — R7301 Impaired fasting glucose: Secondary | ICD-10-CM | POA: Diagnosis not present

## 2023-02-15 DIAGNOSIS — E785 Hyperlipidemia, unspecified: Secondary | ICD-10-CM | POA: Diagnosis not present

## 2023-02-15 DIAGNOSIS — N390 Urinary tract infection, site not specified: Secondary | ICD-10-CM | POA: Diagnosis not present

## 2023-02-16 IMAGING — CT CT CARDIAC CORONARY ARTERY CALCIUM SCORE
2 series · 14 of 20 positions shown, 16 images · non-contrast
Comparison: None.

Addendum:
CLINICAL DATA: Cardiovascular Disease Risk stratification

EXAM:
Coronary Calcium Score
TECHNIQUE: A gated, non-contrast computed tomography scan of the heart was
performed using 3mm slice thickness. Axial images were analyzed on a
dedicated workstation. Calcium scoring of the coronary arteries was
performed using the Agatston method.

[Series 3: cascseq 2.0 b35f 70% · axial · 0.39mm/px · z∈[-212,-136]mm · 6 of 69 slices shown]
[im 8/69  vessel]
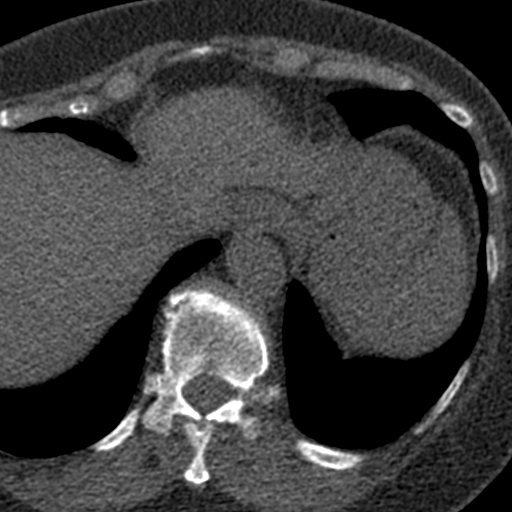
[im 16/69  vessel]
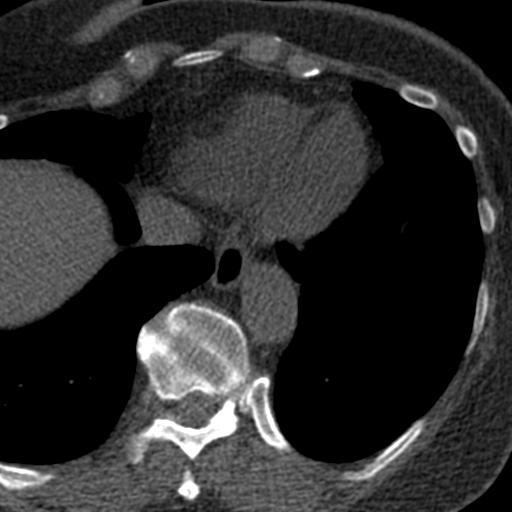
[im 23/69  vessel]
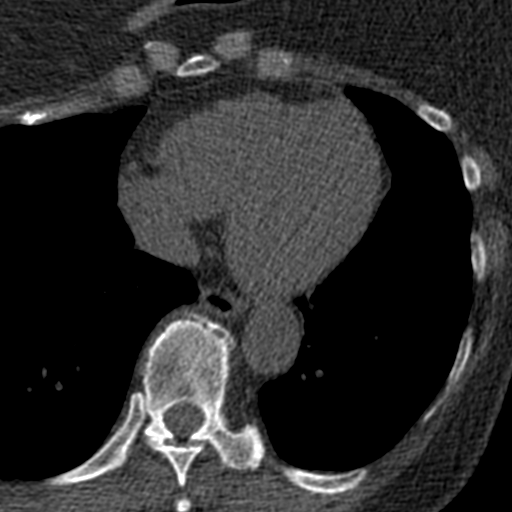
[im 31/69  vessel]
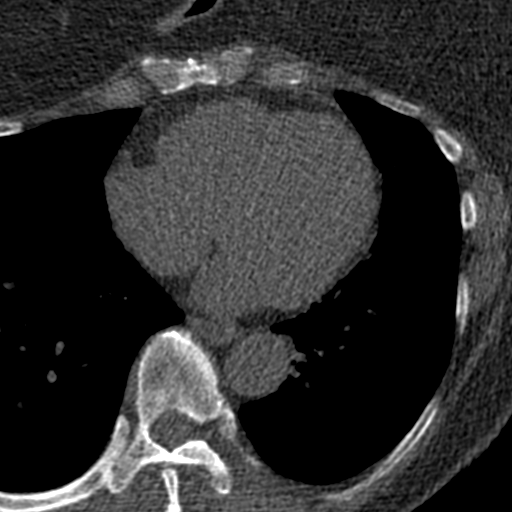
[im 38/69  vessel]
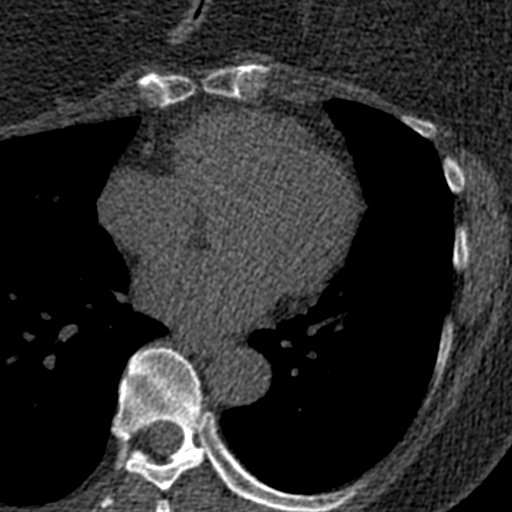
[im 46/69  vessel]
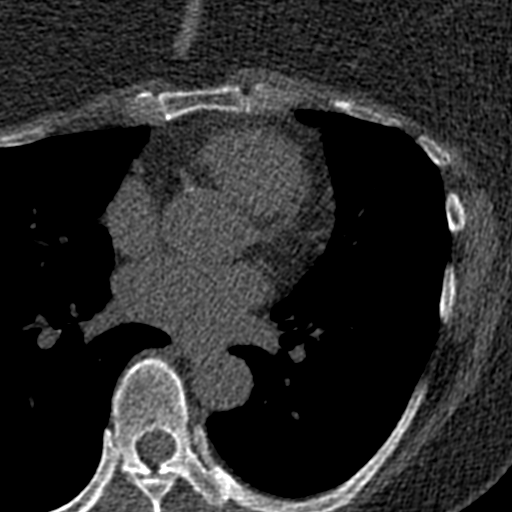

[Series 4: ax st full fov · axial · 0.71mm/px · z∈[-212,-106]mm · 8 of 69 slices shown, 10 images]
[im 8/69  vessel]
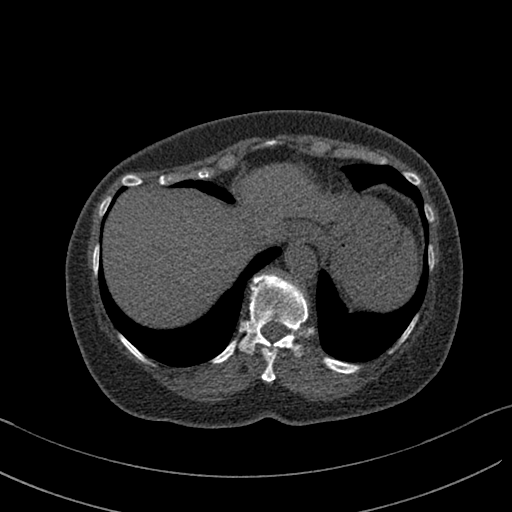
[im 8/69  lung]
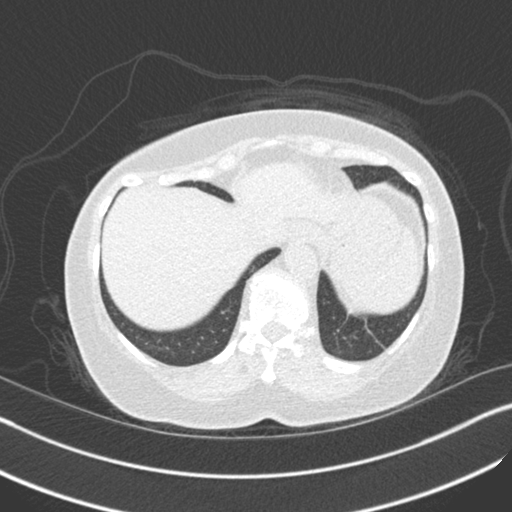
[im 16/69  vessel]
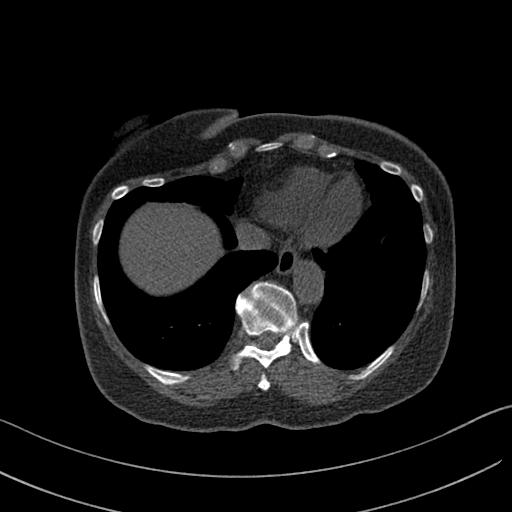
[im 23/69  vessel]
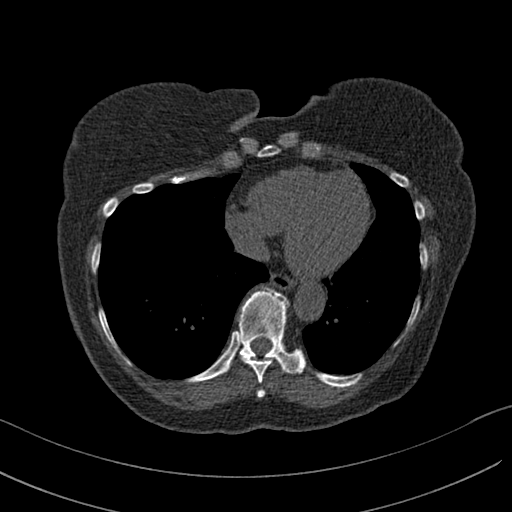
[im 31/69  vessel]
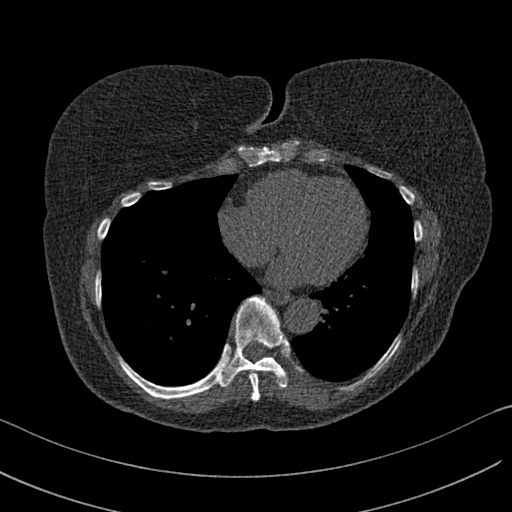
[im 38/69  vessel]
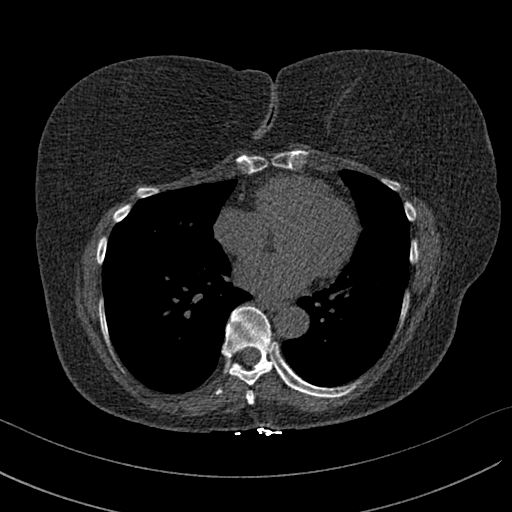
[im 38/69  lung]
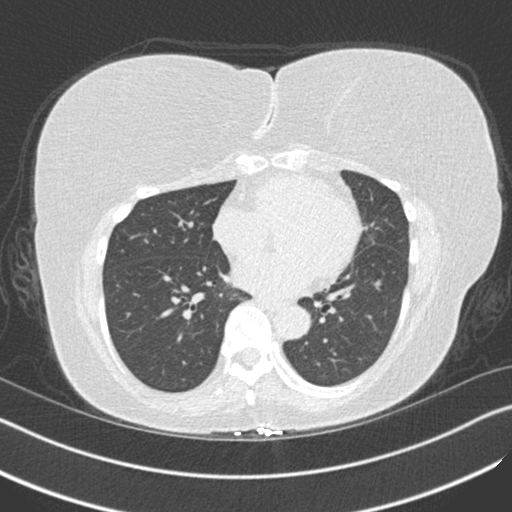
[im 46/69  vessel]
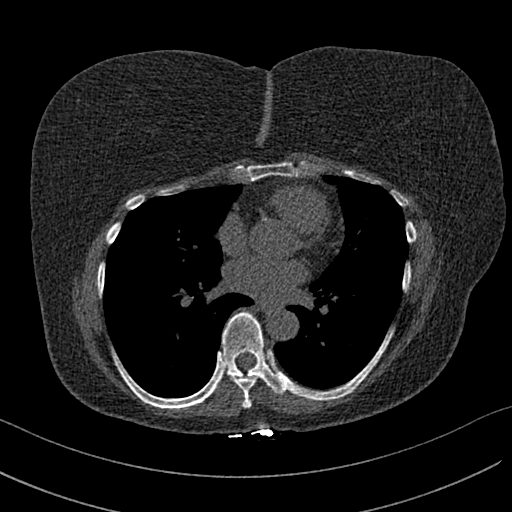
[im 53/69  vessel]
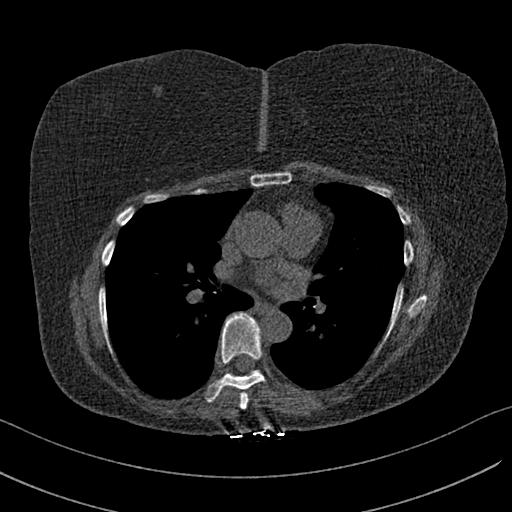
[im 61/69  vessel]
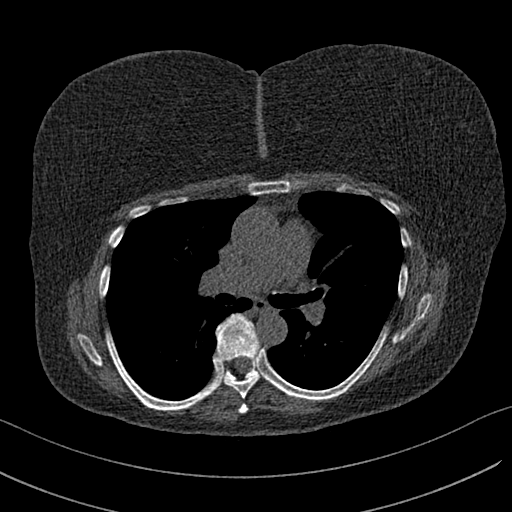

[14 of 20 positions shown; findings below may reference images not displayed]

FINDINGS: Coronary arteries: Normal origins.

Coronary Calcium Score:

Left main: 0

Left anterior descending artery: 13

Left circumflex artery: 0

Right coronary artery: 8

Total: 22

Percentile: 69

Pericardium: Normal.

Ascending Aorta: Normal caliber.

Non-cardiac: See separate report from [REDACTED].
IMPRESSION: Coronary calcium score of 22. This was 69th percentile for age-,
race-, and sex-matched controls.



If CAC=0, it is reasonable to withhold statin therapy and reassess
in 5 to 10 years, as long as higher risk conditions are absent
(diabetes mellitus, family history of premature CHD in first degree
relatives (males <55 years; females <65 years), cigarette smoking,
or LDL >=190 mg/dL).

If CAC is 1 to 99, it is reasonable to initiate statin therapy for
patients >=55 years of age.

If CAC is >=100 or >=75th percentile, it is reasonable to initiate
statin therapy at any age.

Cardiology referral should be considered for patients with CAC
scores >=400 or >=75th percentile.

*9399 AHA/ACC/AACVPR/AAPA/ABC/CORTEZ/HEALTHCARE/CAPATARU/Jean Pierre/JIM/KYUHONG/SALAHUDDIN
Guideline on the Management of Blood Cholesterol: A Report of the
American College of Cardiology/American Heart Association Task Force
on Clinical Practice Guidelines. J Am Coll Cardiol.
7309;73(24):6478-6981.

EXAM:
OVER-READ INTERPRETATION  CT CHEST

The following report is an over-read performed by radiologist Dr.
over-read does not include interpretation of cardiac or coronary
anatomy or pathology. The coronary calcium score interpretation by
the cardiologist is attached.
FINDINGS: No significant noncardiac vascular findings. Visualized mediastinum
and hilar regions demonstrate no lymphadenopathy or masses. There is
some scarring at the left lung base. Visualized lungs show no
evidence of pulmonary edema, consolidation, pneumothorax, nodule or
pleural fluid. Visualized upper abdomen is unremarkable. There is a
rightward convex scoliosis of the thoracic spine without visible
fracture or bony lesion.
IMPRESSION: No significant incidental findings.

*** End of Addendum ***
FINDINGS: Coronary arteries: Normal origins.

Coronary Calcium Score:

Left main: 0

Left anterior descending artery: 13

Left circumflex artery: 0

Right coronary artery: 8

Total: 22

Percentile: 69

Pericardium: Normal.

Ascending Aorta: Normal caliber.

Non-cardiac: See separate report from [REDACTED].
IMPRESSION: Coronary calcium score of 22. This was 69th percentile for age-,
race-, and sex-matched controls.



If CAC=0, it is reasonable to withhold statin therapy and reassess
in 5 to 10 years, as long as higher risk conditions are absent
(diabetes mellitus, family history of premature CHD in first degree
relatives (males <55 years; females <65 years), cigarette smoking,
or LDL >=190 mg/dL).

If CAC is 1 to 99, it is reasonable to initiate statin therapy for
patients >=55 years of age.

If CAC is >=100 or >=75th percentile, it is reasonable to initiate
statin therapy at any age.

Cardiology referral should be considered for patients with CAC
scores >=400 or >=75th percentile.

*9399 AHA/ACC/AACVPR/AAPA/ABC/CORTEZ/HEALTHCARE/CAPATARU/Jean Pierre/JIM/KYUHONG/SALAHUDDIN
Guideline on the Management of Blood Cholesterol: A Report of the
American College of Cardiology/American Heart Association Task Force
on Clinical Practice Guidelines. J Am Coll Cardiol.
7309;73(24):6478-6981.

## 2023-04-08 DIAGNOSIS — H5203 Hypermetropia, bilateral: Secondary | ICD-10-CM | POA: Diagnosis not present

## 2023-04-08 DIAGNOSIS — H2513 Age-related nuclear cataract, bilateral: Secondary | ICD-10-CM | POA: Diagnosis not present

## 2023-04-30 DIAGNOSIS — H25811 Combined forms of age-related cataract, right eye: Secondary | ICD-10-CM | POA: Diagnosis not present

## 2023-04-30 DIAGNOSIS — H2511 Age-related nuclear cataract, right eye: Secondary | ICD-10-CM | POA: Diagnosis not present

## 2023-05-21 DIAGNOSIS — H25812 Combined forms of age-related cataract, left eye: Secondary | ICD-10-CM | POA: Diagnosis not present

## 2023-05-21 DIAGNOSIS — H2512 Age-related nuclear cataract, left eye: Secondary | ICD-10-CM | POA: Diagnosis not present

## 2023-05-31 ENCOUNTER — Ambulatory Visit: Admitting: Podiatry

## 2023-05-31 ENCOUNTER — Ambulatory Visit (INDEPENDENT_AMBULATORY_CARE_PROVIDER_SITE_OTHER)

## 2023-05-31 DIAGNOSIS — L600 Ingrowing nail: Secondary | ICD-10-CM | POA: Diagnosis not present

## 2023-05-31 DIAGNOSIS — M7752 Other enthesopathy of left foot: Secondary | ICD-10-CM

## 2023-05-31 DIAGNOSIS — L84 Corns and callosities: Secondary | ICD-10-CM

## 2023-05-31 NOTE — Patient Instructions (Signed)

## 2023-05-31 NOTE — Progress Notes (Signed)
 Subjective:   Patient ID: Primitivo Gauze, female   DOB: 68 y.o.   MRN: [id number]   HPI Chief Complaint  Patient presents with   Foot Pain    RM#11 Left big toe pain worsening in the last month.   68 year old female presents the office today with concerns of left big toe pain worse than right.  This started about a month ago.  She states that she did hit her toe but did not have any treatment.  She denies any swelling redness or any drainage or pus coming from the toe itself.  She points on the toenail where she gets majority discomfort along the medial aspect.  She states that she used to see a podiatrist once a year but may have cleaned the area to prevent it from hurting.   Review of Systems  All other systems reviewed and are negative.  Past Medical History:  Diagnosis Date   CAD in native artery 12/15/2020   Constipation    Essential hypertension 12/15/2020   GERD (gastroesophageal reflux disease)    Hypertension    Pure hypercholesterolemia 12/15/2020   Sciatica     Past Surgical History:  Procedure Laterality Date   ROTATOR CUFF REPAIR       Current Outpatient Medications:    amLODipine (NORVASC) 10 MG tablet, Take 10 mg by mouth daily., Disp: , Rfl:    Ascorbic Acid 500 MG CHEW, Vitamin C 500 mg chewable tablet  Take by oral route., Disp: , Rfl:    cetirizine (ZYRTEC) 10 MG chewable tablet, Chew 10 mg by mouth daily., Disp: , Rfl:    Cetirizine HCl (ZYRTEC ALLERGY) 10 MG CAPS, Take 10 mg by mouth as needed., Disp: , Rfl:    Cholecalciferol 50 MCG (2000 UT) CAPS, Vitamin D3 50 mcg (2,000 unit) capsule  Take by oral route., Disp: , Rfl:    clindamycin (CLEOCIN T) 1 % lotion, clindamycin 1 % lotion, Disp: , Rfl:    famotidine (PEPCID) 40 MG tablet, Take 40 mg by mouth daily., Disp: , Rfl:    fluticasone (FLONASE) 50 MCG/ACT nasal spray, Place into both nostrils daily., Disp: , Rfl:    fluticasone furoate-vilanterol (BREO ELLIPTA) 100-25 MCG/ACT AEPB, SMARTSIG:1 Puff(s)  Via Inhaler Daily PRN, Disp: , Rfl:    hydrochlorothiazide (HYDRODIURIL) 25 MG tablet, Take 25 mg by mouth every morning., Disp: , Rfl:    lidocaine (LIDODERM) 5 %, Place 1 patch onto the skin daily. Remove & Discard patch within 12 hours or as directed by MD, Disp: 15 patch, Rfl: 0   LORazepam (ATIVAN) 1 MG tablet, Take 1 tablet (1 mg total) by mouth every 8 (eight) hours as needed (muscle spasms)., Disp: 15 tablet, Rfl: 0   metoprolol tartrate (LOPRESSOR) 100 MG tablet, TAKE 1 TABLET 2 HOURS PRIOR TO CT, Disp: 1 tablet, Rfl: 0   metroNIDAZOLE (METROCREAM) 0.75 % cream, SMARTSIG:1 Topical Every Night, Disp: , Rfl:    Multiple Vitamin (MULTIVITAMIN ADULT PO), multivitamin, Disp: , Rfl:    naproxen (NAPROSYN) 500 MG tablet, Take 1 tablet (500 mg total) by mouth 2 (two) times daily with a meal., Disp: 30 tablet, Rfl: 0   nystatin-triamcinolone (MYCOLOG II) cream, Apply topically., Disp: , Rfl:    pantoprazole (PROTONIX) 40 MG tablet, Take 40 mg by mouth daily., Disp: , Rfl:    Probiotic Product (PROBIOTIC-10 PO), Breo Ellipta 100 mcg-25 mcg/dose powder for inhalation  INHALE 1 PUFF BY MOUTH EVERY DAY, Disp: , Rfl:    rosuvastatin (CRESTOR)  10 MG tablet, Take 10 mg by mouth daily., Disp: , Rfl:    celecoxib (CELEBREX) 200 MG capsule, Take 200 mg by mouth as needed. (Patient not taking: Reported on 05/31/2023), Disp: , Rfl:    methylPREDNISolone (MEDROL DOSEPAK) 4 MG TBPK tablet, Take as directed on packet. (Patient not taking: Reported on 05/31/2023), Disp: 21 tablet, Rfl: 0   oxyCODONE-acetaminophen (PERCOCET/ROXICET) 5-325 MG tablet, Take 1 tablet by mouth every 6 (six) hours as needed for severe pain. (Patient not taking: Reported on 05/31/2023), Disp: 10 tablet, Rfl: 0   predniSONE (DELTASONE) 50 MG tablet, Take 1 tablet 13 hours prior to test, then one 7 hours prior to test, and then one 1 hour prior to test (Patient not taking: Reported on 05/31/2023), Disp: 3 tablet, Rfl: 0  Allergies  Allergen  Reactions   Contrast Media [Iodinated Contrast Media]     Itching    Penicillins     Hives Hives           Objective:  Physical Exam  General: AAO x3, NAD  Dermatological: Incurvation present to the medial aspect of bilateral hallux toenail left side worse than right.  There is also thick hyperkeratotic tissue noted adjacent to the toenail the distal portion of the nail which is sharply debrided with any complications or bleeding.  No drainage or pus.  Vascular: Dorsalis Pedis artery and Posterior Tibial artery pedal pulses are 2/4 bilateral with immedate capillary fill time.  There is no pain with calf compression, swelling, warmth, erythema.   Neruologic: Grossly intact via light touch bilateral.   Musculoskeletal: Tenderness on the distal portion of the toenail on the left side.  There is no area pinpoint tenderness.  Gait: Unassisted, Nonantalgic.       Assessment:   68 year old female with ingrown toenail, hyperkeratotic lesion     Plan:  -Treatment options discussed including all alternatives, risks, and complications -Etiology of symptoms were discussed -X-rays were obtained and reviewed with the patient. 3 views left foot were obtained there is no evidence of definitive acute fracture noted. - I do think most of her symptoms are coming from the thick callus, ingrowing of the nail distally.  I sharply debrided the nail without any complications or bleeding also debrided the thick callus with any complications or bleeding.  After debridement symptoms significantly improved.  Dispensed offloading pads.  If symptoms persist may need to proceed with partial nail avulsion.  Discussed shoes to avoid excess pressure.  Charity Conch DPM        Callus distal medial left worse than right Hit 1 month agoget singrowns

## 2023-08-19 DIAGNOSIS — J4520 Mild intermittent asthma, uncomplicated: Secondary | ICD-10-CM | POA: Diagnosis not present

## 2023-08-19 DIAGNOSIS — I10 Essential (primary) hypertension: Secondary | ICD-10-CM | POA: Diagnosis not present

## 2023-08-19 DIAGNOSIS — E785 Hyperlipidemia, unspecified: Secondary | ICD-10-CM | POA: Diagnosis not present

## 2023-08-19 DIAGNOSIS — M7989 Other specified soft tissue disorders: Secondary | ICD-10-CM | POA: Diagnosis not present

## 2023-08-19 DIAGNOSIS — I2510 Atherosclerotic heart disease of native coronary artery without angina pectoris: Secondary | ICD-10-CM | POA: Diagnosis not present

## 2023-08-19 DIAGNOSIS — R252 Cramp and spasm: Secondary | ICD-10-CM | POA: Diagnosis not present

## 2023-08-19 DIAGNOSIS — M170 Bilateral primary osteoarthritis of knee: Secondary | ICD-10-CM | POA: Diagnosis not present

## 2023-09-19 DIAGNOSIS — I2510 Atherosclerotic heart disease of native coronary artery without angina pectoris: Secondary | ICD-10-CM | POA: Diagnosis not present

## 2023-09-19 DIAGNOSIS — E785 Hyperlipidemia, unspecified: Secondary | ICD-10-CM | POA: Diagnosis not present

## 2023-09-19 DIAGNOSIS — I10 Essential (primary) hypertension: Secondary | ICD-10-CM | POA: Diagnosis not present

## 2023-09-19 DIAGNOSIS — J4520 Mild intermittent asthma, uncomplicated: Secondary | ICD-10-CM | POA: Diagnosis not present

## 2023-10-08 DIAGNOSIS — L608 Other nail disorders: Secondary | ICD-10-CM | POA: Diagnosis not present

## 2023-10-08 DIAGNOSIS — D225 Melanocytic nevi of trunk: Secondary | ICD-10-CM | POA: Diagnosis not present

## 2023-10-08 DIAGNOSIS — L810 Postinflammatory hyperpigmentation: Secondary | ICD-10-CM | POA: Diagnosis not present

## 2023-10-08 DIAGNOSIS — L821 Other seborrheic keratosis: Secondary | ICD-10-CM | POA: Diagnosis not present

## 2023-10-08 DIAGNOSIS — S50862A Insect bite (nonvenomous) of left forearm, initial encounter: Secondary | ICD-10-CM | POA: Diagnosis not present

## 2023-10-08 DIAGNOSIS — L814 Other melanin hyperpigmentation: Secondary | ICD-10-CM | POA: Diagnosis not present

## 2023-10-08 DIAGNOSIS — D2271 Melanocytic nevi of right lower limb, including hip: Secondary | ICD-10-CM | POA: Diagnosis not present

## 2023-10-08 DIAGNOSIS — L718 Other rosacea: Secondary | ICD-10-CM | POA: Diagnosis not present

## 2023-10-08 DIAGNOSIS — L218 Other seborrheic dermatitis: Secondary | ICD-10-CM | POA: Diagnosis not present

## 2023-10-08 DIAGNOSIS — L209 Atopic dermatitis, unspecified: Secondary | ICD-10-CM | POA: Diagnosis not present

## 2023-10-20 DIAGNOSIS — J4520 Mild intermittent asthma, uncomplicated: Secondary | ICD-10-CM | POA: Diagnosis not present

## 2023-10-20 DIAGNOSIS — I10 Essential (primary) hypertension: Secondary | ICD-10-CM | POA: Diagnosis not present

## 2023-10-20 DIAGNOSIS — I2510 Atherosclerotic heart disease of native coronary artery without angina pectoris: Secondary | ICD-10-CM | POA: Diagnosis not present

## 2023-10-20 DIAGNOSIS — E785 Hyperlipidemia, unspecified: Secondary | ICD-10-CM | POA: Diagnosis not present

## 2023-10-25 ENCOUNTER — Other Ambulatory Visit: Payer: Self-pay | Admitting: Medical Genetics

## 2023-10-31 DIAGNOSIS — Z23 Encounter for immunization: Secondary | ICD-10-CM | POA: Diagnosis not present

## 2023-11-07 ENCOUNTER — Other Ambulatory Visit (HOSPITAL_BASED_OUTPATIENT_CLINIC_OR_DEPARTMENT_OTHER): Payer: Self-pay | Admitting: Family Medicine

## 2023-11-07 DIAGNOSIS — E2839 Other primary ovarian failure: Secondary | ICD-10-CM

## 2023-11-15 ENCOUNTER — Ambulatory Visit: Admitting: Podiatry

## 2023-11-15 ENCOUNTER — Encounter: Payer: Self-pay | Admitting: Podiatry

## 2023-11-15 DIAGNOSIS — M79674 Pain in right toe(s): Secondary | ICD-10-CM | POA: Diagnosis not present

## 2023-11-15 DIAGNOSIS — B351 Tinea unguium: Secondary | ICD-10-CM

## 2023-11-15 DIAGNOSIS — M79675 Pain in left toe(s): Secondary | ICD-10-CM | POA: Diagnosis not present

## 2023-11-15 DIAGNOSIS — L84 Corns and callosities: Secondary | ICD-10-CM

## 2023-11-15 NOTE — Patient Instructions (Signed)
 You can use UREA 40% CREAM on the nails/callus

## 2023-11-18 NOTE — Progress Notes (Signed)
 Subjective:   Patient ID: Samantha Bright, female   DOB: 68 y.o.   MRN: [id number]   HPI Chief Complaint  Patient presents with   Callouses    Callus build up around nail. Wants nail trim. Non diabetic. Pt has back issues. 8 pain. Epsom salt soaks.     68 year old female presents the office today with concerns of left big toe pain worse than right.  She states that after trimming the callus last appointment was using the toe cap as well her symptoms improved however they have started to come back.  Denies any swelling or redness or any drainage.  Also her nails are thickened elongated and she has difficulty trimming them they cause discomfort.  No open lesions.  Review of Systems  All other systems reviewed and are negative.    Objective:  Physical Exam  General: AAO x3, NAD  Dermatological: Incurvation present to the medial aspect of bilateral hallux toenail left side worse than right.  There is also hyperkeratotic tissue noted adjacent to the toenail the distal portion of the nail which is sharply debrided with any complications or bleeding.  No drainage or pus. Nails are hypertrophic, dystrophic, brittle, discolored, elongated 10. No surrounding redness or drainage. Tenderness nails 1-5 bilaterally.    Vascular: Dorsalis Pedis artery and Posterior Tibial artery pedal pulses are 2/4 bilateral with immedate capillary fill time.  There is no pain with calf compression, swelling, warmth, erythema.   Neruologic: Grossly intact via light touch bilateral.   Musculoskeletal: Tenderness on the distal portion of the toenail on the left side.  There is no area pinpoint tenderness.  Gait: Unassisted, Nonantalgic.       Assessment:   68 year old female with symptomatic onychomycosis, hyperkeratotic lesion     Plan:  Symptomatic onychomycosis -Sharply debrided nails x 10 without any complications or bleeding.  Hyperkeratotic lesion, toe pain -Like result of the callus. -Debrided  hyperkeratotic lesion on the hallux Complications or bleeding.  Continue moisturizer, offloading.  Return in about 3 months (around 02/14/2024) for nail trim, callus .  Donnice JONELLE Fees DPM

## 2023-11-19 DIAGNOSIS — I2510 Atherosclerotic heart disease of native coronary artery without angina pectoris: Secondary | ICD-10-CM | POA: Diagnosis not present

## 2023-11-19 DIAGNOSIS — E785 Hyperlipidemia, unspecified: Secondary | ICD-10-CM | POA: Diagnosis not present

## 2023-11-19 DIAGNOSIS — J4520 Mild intermittent asthma, uncomplicated: Secondary | ICD-10-CM | POA: Diagnosis not present

## 2023-11-19 DIAGNOSIS — I10 Essential (primary) hypertension: Secondary | ICD-10-CM | POA: Diagnosis not present

## 2023-12-02 ENCOUNTER — Encounter (HOSPITAL_BASED_OUTPATIENT_CLINIC_OR_DEPARTMENT_OTHER): Payer: Self-pay | Admitting: Emergency Medicine

## 2023-12-02 ENCOUNTER — Inpatient Hospital Stay (HOSPITAL_BASED_OUTPATIENT_CLINIC_OR_DEPARTMENT_OTHER)
Admission: EM | Admit: 2023-12-02 | Discharge: 2023-12-07 | DRG: 389 | Disposition: A | Discharge status: Home / Self Care | Attending: Internal Medicine | Admitting: Internal Medicine

## 2023-12-02 ENCOUNTER — Other Ambulatory Visit: Payer: Self-pay

## 2023-12-02 ENCOUNTER — Emergency Department (HOSPITAL_BASED_OUTPATIENT_CLINIC_OR_DEPARTMENT_OTHER)

## 2023-12-02 DIAGNOSIS — E870 Hyperosmolality and hypernatremia: Secondary | ICD-10-CM | POA: Diagnosis not present

## 2023-12-02 DIAGNOSIS — I2510 Atherosclerotic heart disease of native coronary artery without angina pectoris: Secondary | ICD-10-CM | POA: Diagnosis not present

## 2023-12-02 DIAGNOSIS — M5432 Sciatica, left side: Secondary | ICD-10-CM | POA: Diagnosis present

## 2023-12-02 DIAGNOSIS — K56600 Partial intestinal obstruction, unspecified as to cause: Secondary | ICD-10-CM | POA: Diagnosis not present

## 2023-12-02 DIAGNOSIS — N139 Obstructive and reflux uropathy, unspecified: Secondary | ICD-10-CM | POA: Diagnosis present

## 2023-12-02 DIAGNOSIS — Z880 Allergy status to penicillin: Secondary | ICD-10-CM

## 2023-12-02 DIAGNOSIS — D72829 Elevated white blood cell count, unspecified: Secondary | ICD-10-CM | POA: Diagnosis present

## 2023-12-02 DIAGNOSIS — Z9049 Acquired absence of other specified parts of digestive tract: Secondary | ICD-10-CM | POA: Diagnosis not present

## 2023-12-02 DIAGNOSIS — Z79899 Other long term (current) drug therapy: Secondary | ICD-10-CM | POA: Diagnosis not present

## 2023-12-02 DIAGNOSIS — Z888 Allergy status to other drugs, medicaments and biological substances status: Secondary | ICD-10-CM

## 2023-12-02 DIAGNOSIS — M5431 Sciatica, right side: Secondary | ICD-10-CM | POA: Diagnosis not present

## 2023-12-02 DIAGNOSIS — I10 Essential (primary) hypertension: Secondary | ICD-10-CM | POA: Diagnosis present

## 2023-12-02 DIAGNOSIS — Z833 Family history of diabetes mellitus: Secondary | ICD-10-CM

## 2023-12-02 DIAGNOSIS — K5909 Other constipation: Secondary | ICD-10-CM | POA: Diagnosis not present

## 2023-12-02 DIAGNOSIS — K219 Gastro-esophageal reflux disease without esophagitis: Secondary | ICD-10-CM | POA: Diagnosis not present

## 2023-12-02 DIAGNOSIS — Z90710 Acquired absence of both cervix and uterus: Secondary | ICD-10-CM

## 2023-12-02 DIAGNOSIS — R932 Abnormal findings on diagnostic imaging of liver and biliary tract: Secondary | ICD-10-CM | POA: Diagnosis not present

## 2023-12-02 DIAGNOSIS — Z8249 Family history of ischemic heart disease and other diseases of the circulatory system: Secondary | ICD-10-CM

## 2023-12-02 DIAGNOSIS — K769 Liver disease, unspecified: Secondary | ICD-10-CM | POA: Diagnosis not present

## 2023-12-02 DIAGNOSIS — E7800 Pure hypercholesterolemia, unspecified: Secondary | ICD-10-CM | POA: Diagnosis present

## 2023-12-02 DIAGNOSIS — K56609 Unspecified intestinal obstruction, unspecified as to partial versus complete obstruction: Secondary | ICD-10-CM | POA: Diagnosis not present

## 2023-12-02 DIAGNOSIS — N200 Calculus of kidney: Secondary | ICD-10-CM | POA: Diagnosis not present

## 2023-12-02 DIAGNOSIS — R000 Tachycardia, unspecified: Secondary | ICD-10-CM | POA: Diagnosis present

## 2023-12-02 DIAGNOSIS — Z91041 Radiographic dye allergy status: Secondary | ICD-10-CM | POA: Diagnosis not present

## 2023-12-02 DIAGNOSIS — Z7951 Long term (current) use of inhaled steroids: Secondary | ICD-10-CM

## 2023-12-02 LAB — CBC
HCT: 43.9 % (ref 36.0–46.0)
Hemoglobin: 14.8 g/dL (ref 12.0–15.0)
MCH: 31.1 pg (ref 26.0–34.0)
MCHC: 33.7 g/dL (ref 30.0–36.0)
MCV: 92.2 fL (ref 80.0–100.0)
Platelets: 231 K/uL (ref 150–400)
RBC: 4.76 MIL/uL (ref 3.87–5.11)
RDW: 12 % (ref 11.5–15.5)
WBC: 9.8 K/uL (ref 4.0–10.5)
nRBC: 0 % (ref 0.0–0.2)

## 2023-12-02 LAB — URINALYSIS, ROUTINE W REFLEX MICROSCOPIC
Bilirubin Urine: NEGATIVE
Glucose, UA: NEGATIVE mg/dL
Hgb urine dipstick: NEGATIVE
Ketones, ur: 15 mg/dL — AB
Nitrite: NEGATIVE
Protein, ur: NEGATIVE mg/dL
Specific Gravity, Urine: <=1.005 (ref 1.005–1.030)
pH: 6.5 (ref 5.0–8.0)

## 2023-12-02 LAB — COMPREHENSIVE METABOLIC PANEL WITH GFR
ALT: 12 U/L (ref 0–44)
AST: 27 U/L (ref 15–41)
Albumin: 4.6 g/dL (ref 3.5–5.0)
Alkaline Phosphatase: 136 U/L — ABNORMAL HIGH (ref 38–126)
Anion gap: 14 (ref 5–15)
BUN: 8 mg/dL (ref 8–23)
CO2: 25 mmol/L (ref 22–32)
Calcium: 10.4 mg/dL — ABNORMAL HIGH (ref 8.9–10.3)
Chloride: 99 mmol/L (ref 98–111)
Creatinine, Ser: 0.61 mg/dL (ref 0.44–1.00)
GFR, Estimated: >60 mL/min (ref >60)
Glucose, Bld: 106 mg/dL — ABNORMAL HIGH (ref 70–99)
Potassium: 3.8 mmol/L (ref 3.5–5.1)
Sodium: 138 mmol/L (ref 135–145)
Total Bilirubin: 0.6 mg/dL (ref 0.0–1.2)
Total Protein: 7.9 g/dL (ref 6.5–8.1)

## 2023-12-02 LAB — URINALYSIS, MICROSCOPIC (REFLEX): RBC / HPF: NONE SEEN RBC/hpf (ref 0–5)

## 2023-12-02 LAB — LIPASE, BLOOD: Lipase: 18 U/L (ref 11–51)

## 2023-12-02 MED ORDER — ONDANSETRON HCL 4 MG/2ML IJ SOLN
4.0000 mg | Freq: Once | INTRAMUSCULAR | Status: AC
  Administered 2023-12-02: 4 mg via INTRAVENOUS
  Filled 2023-12-02: qty 2

## 2023-12-02 MED ORDER — SODIUM CHLORIDE 0.9 % IV BOLUS
1000.0000 mL | Freq: Once | INTRAVENOUS | Status: AC
  Administered 2023-12-02: 1000 mL via INTRAVENOUS

## 2023-12-02 MED ORDER — MORPHINE SULFATE (PF) 4 MG/ML IV SOLN
4.0000 mg | Freq: Once | INTRAVENOUS | Status: AC
  Administered 2023-12-02: 4 mg via INTRAVENOUS
  Filled 2023-12-02: qty 1

## 2023-12-02 NOTE — ED Notes (Signed)
 Provided pt with specimen cup for urine sample. She states she is unable to provide a sample at this time.

## 2023-12-02 NOTE — ED Provider Notes (Signed)
 [facility] Provider Note   CSN: [id number] Arrival date & time: 12/02/23  1907     Patient presents with: Abdominal Pain   Samantha Bright is a 68 y.o. female.   68 yo F with a chief complaints of abdominal pain.  Going on since about 1030 last night.  She has not moved her bowels in a couple days.  Has since had intractable nausea and vomiting.  Still feels like she cannot move her bowels and has not passed gas either.  Has a history of a small bowel obstruction.  Has a prior cholecystectomy and hysterectomy.  No fevers.   Abdominal Pain      Prior to Admission medications   Medication Sig Start Date End Date Taking? Authorizing Provider  amLODipine (NORVASC) 10 MG tablet Take 10 mg by mouth daily. Patient not taking: Reported on 11/15/2023    [provider]  Ascorbic Acid 500 MG CHEW Vitamin C 500 mg chewable tablet  Take by oral route.    [provider]  celecoxib (CELEBREX) 200 MG capsule Take 200 mg by mouth as needed. Patient not taking: Reported on 11/15/2023 01/13/20   [provider]  cetirizine (ZYRTEC) 10 MG chewable tablet Chew 10 mg by mouth daily.    [provider]  Cetirizine HCl (ZYRTEC ALLERGY) 10 MG CAPS Take 10 mg by mouth as needed.    [provider]  Cholecalciferol 50 MCG (2000 UT) CAPS Vitamin D3 50 mcg (2,000 unit) capsule  Take by oral route.    [provider]  clindamycin (CLEOCIN T) 1 % lotion clindamycin 1 % lotion Patient not taking: Reported on 11/15/2023    [provider]  famotidine (PEPCID) 40 MG tablet Take 40 mg by mouth daily.    [provider]  fluticasone (FLONASE) 50 MCG/ACT nasal spray Place into both nostrils daily.    [provider]  fluticasone furoate-vilanterol (BREO ELLIPTA) 100-25 MCG/ACT AEPB SMARTSIG:1 Puff(s) Via Inhaler Daily PRN 10/04/20   [provider]  hydrochlorothiazide  (HYDRODIURIL) 25 MG tablet Take 25 mg by mouth every morning. 03/29/20   [provider]  lidocaine  (LIDODERM ) 5 % Place 1 patch onto the skin daily. Remove & Discard patch within 12 hours or as directed by MD 03/13/22   Horton, Charmaine FALCON, MD  LORazepam  (ATIVAN ) 1 MG tablet Take 1 tablet (1 mg total) by mouth every 8 (eight) hours as needed (muscle spasms). 03/12/20   Molpus, John, MD  methylPREDNISolone  (MEDROL  DOSEPAK) 4 MG TBPK tablet Take as directed on packet. Patient not taking: Reported on 11/15/2023 03/13/22   Horton, Charmaine FALCON, MD  metoprolol  tartrate (LOPRESSOR ) 100 MG tablet TAKE 1 TABLET 2 HOURS PRIOR TO CT 12/15/20   Raford Riggs, MD  metroNIDAZOLE (METROCREAM) 0.75 % cream SMARTSIG:1 Topical Every Night 01/28/20   [provider]  Multiple Vitamin (MULTIVITAMIN ADULT PO) multivitamin    [provider]  naproxen  (NAPROSYN ) 500 MG tablet Take 1 tablet (500 mg total) by mouth 2 (two) times daily with a meal. Patient not taking: Reported on 11/15/2023 03/19/20   Cleotilde Rogue, MD  nystatin-triamcinolone (MYCOLOG II) cream Apply topically. 11/25/19   [provider]  oxyCODONE -acetaminophen  (PERCOCET/ROXICET) 5-325 MG tablet Take 1 tablet by mouth every 6 (six) hours as needed for severe pain. Patient not taking: Reported on 11/15/2023 03/13/22   Horton, Charmaine FALCON, MD  pantoprazole (PROTONIX) 40 MG tablet Take 40 mg by mouth daily. 11/25/20  [provider]  predniSONE  (DELTASONE ) 50 MG tablet Take 1 tablet 13 hours prior to test, then one 7 hours prior to test, and then one 1 hour prior to test Patient not taking: Reported on 11/15/2023 12/15/20   Raford Riggs, MD  Probiotic Product (PROBIOTIC-10 PO) Breo Ellipta 100 mcg-25 mcg/dose powder for inhalation  INHALE 1 PUFF BY MOUTH EVERY DAY    [provider]  rosuvastatin (CRESTOR) 10 MG tablet Take 10 mg by mouth daily. 10/09/20   [provider]    Allergies: Contrast media  [iodinated contrast media], Omeprazole, and Penicillins    Review of Systems  Gastrointestinal:  Positive for abdominal pain.    Updated Vital Signs BP (!) 145/83   Pulse 79   Temp 98.6 F (37 C) (Oral)   Resp 18   Ht 5' 8 (1.727 m)   Wt 74.8 kg   SpO2 93%   BMI 25.09 kg/m   Physical Exam Vitals and nursing note reviewed.  Constitutional:      General: She is not in acute distress.    Appearance: She is well-developed. She is not diaphoretic.  HENT:     Head: Normocephalic and atraumatic.  Eyes:     Pupils: Pupils are equal, round, and reactive to light.  Cardiovascular:     Rate and Rhythm: Normal rate and regular rhythm.     Heart sounds: No murmur heard.    No friction rub. No gallop.  Pulmonary:     Effort: Pulmonary effort is normal.     Breath sounds: No wheezing or rales.  Abdominal:     General: There is no distension.     Palpations: Abdomen is soft.     Tenderness: There is abdominal tenderness.     Comments: Diffuse abdominal discomfort  Musculoskeletal:        General: No tenderness.     Cervical back: Normal range of motion and neck supple.  Skin:    General: Skin is warm and dry.  Neurological:     Mental Status: She is alert and oriented to person, place, and time.  Psychiatric:        Behavior: Behavior normal.     (all labs ordered are listed, but only abnormal results are displayed) Labs Reviewed  COMPREHENSIVE METABOLIC PANEL WITH GFR - Abnormal; Notable for the following components:      Result Value   Glucose, Bld 106 (*)    Calcium 10.4 (*)    Alkaline Phosphatase 136 (*)    All other components within normal limits  URINALYSIS, ROUTINE W REFLEX MICROSCOPIC - Abnormal; Notable for the following components:   Ketones, ur 15 (*)    Leukocytes,Ua SMALL (*)    All other components within normal limits  URINALYSIS, MICROSCOPIC (REFLEX) - Abnormal; Notable for the following components:   Bacteria, UA RARE (*)    All other components  within normal limits  LIPASE, BLOOD  CBC    EKG: None  Radiology: CT ABDOMEN PELVIS WO CONTRAST Result Date: 12/02/2023 EXAM: CT ABDOMEN AND PELVIS WITHOUT CONTRAST 12/02/2023 08:33:20 PM TECHNIQUE: CT of the abdomen and pelvis was performed without the administration of intravenous contrast. Multiplanar reformatted images are provided for review. Automated exposure control, iterative reconstruction, and/or weight-based adjustment of the mA/kV was utilized to reduce the radiation dose to as low as reasonably achievable. COMPARISON: None available. CLINICAL HISTORY: Abdominal pain, acute, nonlocalized; abd pain, emesis. Nonlocalized abdominal pain, pt with a hx of bowel obstruction, pt states  she feels like she has another one. Unable to have bowel movement for a few days, emesis today. FINDINGS: LOWER CHEST: No acute abnormality. LIVER: 1.9 x 1.7 cm right posterior hepatic lobe hypodense lesion with a density of 28 Hounsfield units. 1.3 x 1.1 cm fluid density lesion within the right hepatic lobe that likely represents a simple hepatic cyst. GALLBLADDER AND BILE DUCTS: Status post cholecystectomy. No biliary ductal dilatation. SPLEEN: No acute abnormality. PANCREAS: No acute abnormality. ADRENAL GLANDS: No acute abnormality. KIDNEYS, URETERS AND BLADDER: Bilateral nephrolithiasis measuring up to 3 mm on the right and punctate on the left. No ureterolithiasis bilaterally. No associated hydroureteronephrosis. No perinephric or periureteral stranding. Urinary bladder is unremarkable. GI AND BOWEL: Stomach demonstrates no acute abnormality. Several loops of small bowel are distended with fluid within the pelvis (2:66). No frank dilatation of the small bowel. Trace mesenteric fat stranding noted within the left lower abdomen/pelvis (2:54) with associated possibly developing transition point (7:79). PERITONEUM AND RETROPERITONEUM: No ascites. No free air. VASCULATURE: Aorta is normal in caliber. At least  moderate atherosclerotic plaque of the aorta and its branches. LYMPH NODES: No lymphadenopathy. REPRODUCTIVE ORGANS: No acute abnormality. BONES AND SOFT TISSUES: Multilevel mild degenerative changes of the spine with disc bulge at the L3-L4, L4-L5, L5-S1 level. Multilevel intervertebral disc space vacuum phenomenon. No acute osseous abnormality. No focal soft tissue abnormality. IMPRESSION: 1. Question developing/early small bowel obstruction with small bowel fluid distention, mild mesenteric fat stranding in the setting of a likely transition point within the left pelvis. 2. Nonobstructive bilateral nephrolithiasis. 3. Indeterminate 1.9 x 1.7 cm right posterior hepatic lobe hypodense lesion. Limited evaluation of this noncontrast study. Electronically signed by: Morgane Naveau MD 12/02/2023 08:48 PM EDT RP Workstation: [id number]     Procedures   Medications Ordered in the ED  morphine (PF) 4 MG/ML injection 4 mg (4 mg Intravenous Given 12/02/23 2129)  ondansetron  (ZOFRAN ) injection 4 mg (4 mg Intravenous Given 12/02/23 2123)  sodium chloride 0.9 % bolus 1,000 mL (0 mLs Intravenous Stopped 12/02/23 2235)  morphine (PF) 4 MG/ML injection 4 mg (4 mg Intravenous Given 12/02/23 2244)                                    Medical Decision Making Amount and/or Complexity of Data Reviewed Labs: ordered.  Risk Prescription drug management.   68 yo F with a chief complaints of abdominal pain.  Going on since last night.  Epigastric and lower.  Seems to come and go.  History of a bowel obstruction and this feels similar.  Has not moved her bowels or passed gas in a couple days.  Pain started last night.  Intractable vomiting since.  No leukocytosis no anemia no significant electrolyte abnormalities LFTs and lipase are unremarkable.  CTA with likely early small bowel obstruction.  Perhaps transition point.  Will discuss with general surgery.  Discussed case with Dr. Ebbie, gen surgery.   Recommended NG tube small bowel protocol medical admission.  Patient declining NG.   The patients results and plan were reviewed and discussed.   Any x-rays performed were independently reviewed by myself.   Differential diagnosis were considered with the presenting HPI.  Medications  morphine (PF) 4 MG/ML injection 4 mg (4 mg Intravenous Given 12/02/23 2129)  ondansetron  (ZOFRAN ) injection 4 mg (4 mg Intravenous Given 12/02/23 2123)  sodium chloride 0.9 % bolus 1,000 mL (0 mLs Intravenous Stopped  12/02/23 2235)  morphine (PF) 4 MG/ML injection 4 mg (4 mg Intravenous Given 12/02/23 2244)    Vitals:   12/02/23 1915 12/02/23 2215  BP: (!) 145/78 (!) 145/83  Pulse: 92 79  Resp: 16 18  Temp: 98.6 F (37 C)   TempSrc: Oral   SpO2: 99% 93%  Weight: 74.8 kg   Height: 5' 8 (1.727 m)     Final diagnoses:  SBO (small bowel obstruction) (HCC)    Admission/ observation were discussed with the admitting physician, patient and/or family and they are comfortable with the plan.       Final diagnoses:  SBO (small bowel obstruction) [facility])    ED Discharge Orders     None          Emil Share, DO 12/02/23 2303

## 2023-12-02 NOTE — ED Triage Notes (Signed)
 Pt via POV c/o lower 10/10 cramping abdominal pain and n/v since 10:30pm last night. 3 episodes vomiting since onset of symptoms. Pt reports being constipated for several days. Prior hx bowel obstruction with similar symptoms.

## 2023-12-03 ENCOUNTER — Inpatient Hospital Stay (HOSPITAL_COMMUNITY)

## 2023-12-03 DIAGNOSIS — K5669 Other partial intestinal obstruction: Secondary | ICD-10-CM | POA: Diagnosis not present

## 2023-12-03 DIAGNOSIS — K56609 Unspecified intestinal obstruction, unspecified as to partial versus complete obstruction: Secondary | ICD-10-CM | POA: Diagnosis not present

## 2023-12-03 DIAGNOSIS — E870 Hyperosmolality and hypernatremia: Secondary | ICD-10-CM | POA: Diagnosis not present

## 2023-12-03 DIAGNOSIS — I4729 Other ventricular tachycardia: Secondary | ICD-10-CM | POA: Diagnosis not present

## 2023-12-03 DIAGNOSIS — M5431 Sciatica, right side: Secondary | ICD-10-CM | POA: Diagnosis not present

## 2023-12-03 DIAGNOSIS — Z9049 Acquired absence of other specified parts of digestive tract: Secondary | ICD-10-CM | POA: Diagnosis not present

## 2023-12-03 DIAGNOSIS — N139 Obstructive and reflux uropathy, unspecified: Secondary | ICD-10-CM | POA: Diagnosis not present

## 2023-12-03 DIAGNOSIS — E7800 Pure hypercholesterolemia, unspecified: Secondary | ICD-10-CM | POA: Diagnosis not present

## 2023-12-03 DIAGNOSIS — Z4682 Encounter for fitting and adjustment of non-vascular catheter: Secondary | ICD-10-CM | POA: Diagnosis not present

## 2023-12-03 DIAGNOSIS — Z880 Allergy status to penicillin: Secondary | ICD-10-CM | POA: Diagnosis not present

## 2023-12-03 DIAGNOSIS — I2510 Atherosclerotic heart disease of native coronary artery without angina pectoris: Secondary | ICD-10-CM | POA: Diagnosis not present

## 2023-12-03 DIAGNOSIS — R000 Tachycardia, unspecified: Secondary | ICD-10-CM | POA: Diagnosis not present

## 2023-12-03 DIAGNOSIS — D72829 Elevated white blood cell count, unspecified: Secondary | ICD-10-CM | POA: Diagnosis not present

## 2023-12-03 DIAGNOSIS — K56600 Partial intestinal obstruction, unspecified as to cause: Secondary | ICD-10-CM | POA: Diagnosis not present

## 2023-12-03 DIAGNOSIS — J9811 Atelectasis: Secondary | ICD-10-CM | POA: Diagnosis not present

## 2023-12-03 DIAGNOSIS — Z90710 Acquired absence of both cervix and uterus: Secondary | ICD-10-CM | POA: Diagnosis not present

## 2023-12-03 DIAGNOSIS — Z79899 Other long term (current) drug therapy: Secondary | ICD-10-CM | POA: Diagnosis not present

## 2023-12-03 DIAGNOSIS — Z833 Family history of diabetes mellitus: Secondary | ICD-10-CM | POA: Diagnosis not present

## 2023-12-03 DIAGNOSIS — Z888 Allergy status to other drugs, medicaments and biological substances status: Secondary | ICD-10-CM | POA: Diagnosis not present

## 2023-12-03 DIAGNOSIS — Z91041 Radiographic dye allergy status: Secondary | ICD-10-CM | POA: Diagnosis not present

## 2023-12-03 DIAGNOSIS — Z7951 Long term (current) use of inhaled steroids: Secondary | ICD-10-CM | POA: Diagnosis not present

## 2023-12-03 DIAGNOSIS — Z8249 Family history of ischemic heart disease and other diseases of the circulatory system: Secondary | ICD-10-CM | POA: Diagnosis not present

## 2023-12-03 DIAGNOSIS — I10 Essential (primary) hypertension: Secondary | ICD-10-CM | POA: Diagnosis not present

## 2023-12-03 DIAGNOSIS — K219 Gastro-esophageal reflux disease without esophagitis: Secondary | ICD-10-CM | POA: Diagnosis not present

## 2023-12-03 DIAGNOSIS — M5432 Sciatica, left side: Secondary | ICD-10-CM | POA: Diagnosis not present

## 2023-12-03 DIAGNOSIS — K5909 Other constipation: Secondary | ICD-10-CM | POA: Diagnosis not present

## 2023-12-03 LAB — HIV ANTIBODY (ROUTINE TESTING W REFLEX): HIV Screen 4th Generation wRfx: NONREACTIVE

## 2023-12-03 LAB — CBC
HCT: 44.2 % (ref 36.0–46.0)
Hemoglobin: 13.7 g/dL (ref 12.0–15.0)
MCH: 30 pg (ref 26.0–34.0)
MCHC: 31 g/dL (ref 30.0–36.0)
MCV: 96.9 fL (ref 80.0–100.0)
Platelets: 222 K/uL (ref 150–400)
RBC: 4.56 MIL/uL (ref 3.87–5.11)
RDW: 12.2 % (ref 11.5–15.5)
WBC: 9 K/uL (ref 4.0–10.5)
nRBC: 0 % (ref 0.0–0.2)

## 2023-12-03 LAB — CREATININE, SERUM
Creatinine, Ser: 0.58 mg/dL (ref 0.44–1.00)
GFR, Estimated: >60 mL/min (ref >60)

## 2023-12-03 MED ORDER — METHYLPREDNISOLONE SODIUM SUCC 40 MG IJ SOLR
40.0000 mg | Freq: Once | INTRAMUSCULAR | Status: AC
  Administered 2023-12-03: 40 mg via INTRAVENOUS
  Filled 2023-12-03: qty 1

## 2023-12-03 MED ORDER — PROCHLORPERAZINE EDISYLATE 10 MG/2ML IJ SOLN
10.0000 mg | Freq: Once | INTRAMUSCULAR | Status: AC
  Filled 2023-12-03: qty 2

## 2023-12-03 MED ORDER — KETOROLAC TROMETHAMINE 15 MG/ML IJ SOLN
15.0000 mg | Freq: Once | INTRAMUSCULAR | Status: AC
  Administered 2023-12-03: 15 mg via INTRAVENOUS
  Filled 2023-12-03: qty 1

## 2023-12-03 MED ORDER — ONDANSETRON HCL 4 MG/2ML IJ SOLN
4.0000 mg | Freq: Four times a day (QID) | INTRAMUSCULAR | Status: DC | PRN
  Administered 2023-12-03 (×2): 4 mg via INTRAVENOUS
  Filled 2023-12-03 (×2): qty 2

## 2023-12-03 MED ORDER — SODIUM CHLORIDE 0.9 % IV SOLN: INTRAVENOUS | Status: AC

## 2023-12-03 MED ORDER — OXYCODONE HCL 5 MG PO TABS: 2.5000 mg | ORAL_TABLET | ORAL | Status: DC | PRN

## 2023-12-03 MED ORDER — HYDRALAZINE HCL 20 MG/ML IJ SOLN: 2.0000 mg | INTRAMUSCULAR | Status: DC | PRN

## 2023-12-03 MED ORDER — PROCHLORPERAZINE EDISYLATE 10 MG/2ML IJ SOLN
10.0000 mg | Freq: Four times a day (QID) | INTRAMUSCULAR | Status: DC | PRN
  Administered 2023-12-03: 10 mg via INTRAVENOUS
  Filled 2023-12-03: qty 2

## 2023-12-03 MED ORDER — OXYCODONE HCL 5 MG PO TABS
5.0000 mg | ORAL_TABLET | ORAL | Status: DC | PRN
  Administered 2023-12-03: 5 mg via ORAL
  Filled 2023-12-03 (×2): qty 1

## 2023-12-03 MED ORDER — ONDANSETRON HCL 4 MG/2ML IJ SOLN
4.0000 mg | Freq: Once | INTRAMUSCULAR | Status: AC
  Administered 2023-12-03: 4 mg via INTRAVENOUS
  Filled 2023-12-03: qty 2

## 2023-12-03 MED ORDER — HEPARIN SODIUM (PORCINE) 5000 UNIT/ML IJ SOLN
5000.0000 [IU] | Freq: Three times a day (TID) | INTRAMUSCULAR | Status: DC
  Administered 2023-12-03 – 2023-12-07 (×11): 5000 [IU] via SUBCUTANEOUS
  Filled 2023-12-03 (×11): qty 1

## 2023-12-03 MED ORDER — DIATRIZOATE MEGLUMINE & SODIUM 66-10 % PO SOLN
90.0000 mL | Freq: Once | ORAL | Status: AC
  Administered 2023-12-03: 90 mL via ORAL
  Filled 2023-12-03: qty 90

## 2023-12-03 MED ORDER — ACETAMINOPHEN 325 MG PO TABS
650.0000 mg | ORAL_TABLET | Freq: Four times a day (QID) | ORAL | Status: DC | PRN
  Administered 2023-12-03 – 2023-12-07 (×6): 650 mg via ORAL
  Filled 2023-12-03 (×6): qty 2

## 2023-12-03 MED ORDER — ONDANSETRON HCL 4 MG/2ML IJ SOLN
4.0000 mg | Freq: Four times a day (QID) | INTRAMUSCULAR | Status: DC | PRN
  Administered 2023-12-03: 4 mg via INTRAVENOUS
  Filled 2023-12-03: qty 2

## 2023-12-03 MED ORDER — ONDANSETRON HCL 4 MG PO TABS: 4.0000 mg | ORAL_TABLET | Freq: Four times a day (QID) | ORAL | Status: DC | PRN

## 2023-12-03 MED ORDER — SODIUM CHLORIDE 0.9 % IV SOLN
25.0000 mg | Freq: Once | INTRAVENOUS | Status: AC
  Administered 2023-12-03: 25 mg via INTRAVENOUS
  Filled 2023-12-03: qty 25

## 2023-12-03 MED ORDER — ACETAMINOPHEN 650 MG RE SUPP: 650.0000 mg | Freq: Four times a day (QID) | RECTAL | Status: DC | PRN

## 2023-12-03 MED ORDER — ACETAMINOPHEN 500 MG PO TABS: 1000.0000 mg | ORAL_TABLET | Freq: Four times a day (QID) | ORAL | Status: DC | PRN

## 2023-12-03 MED ORDER — HYDROMORPHONE HCL 1 MG/ML IJ SOLN
0.5000 mg | INTRAMUSCULAR | Status: DC | PRN
  Administered 2023-12-03 – 2023-12-05 (×4): 0.5 mg via INTRAVENOUS
  Filled 2023-12-03 (×4): qty 0.5

## 2023-12-03 NOTE — Plan of Care (Signed)

## 2023-12-03 NOTE — H&P (Signed)
 History and Physical    Samantha Bright FMW:[id number] DOB: 28-Jun-1955 DOA: 12/02/2023  PCP: Chrystal Lamarr RAMAN, MD   Chief Complaint: Abdominal pain  HPI: Samantha Bright is a 68 y.o. female who presents with worsening abdominal pain and reports no bowel movement in 4+ days.  Patient has known medical history of hypertension hyperlipidemia GERD, CAD, recurrent mechanical falls and chronic sciatica with history of multiple abdominal surgeries and previous small bowel obstruction.  Imaging at intake concerning for possible early small bowel obstruction with transition point of the left pelvis, given patient's medical history, inability to tolerate p.o. due to nausea and profound constipation hospitalist was called for admission.  Dr. Ebbie with general surgery was called in consult.  Review of Systems: As per HPI otherwise denies headache fevers chills shortness of breath or diarrhea.   Assessment/Plan Principal Problem:   Partial small bowel obstruction (HCC)   Small bowel obstruction early versus partial, POA -General Surgery following, appreciate insight recommendations -Gastrografin study later today per surgery -Clinically improving, continue n.p.o. status until cleared by surgery - NG tube offered, currently on hold due to resolution of vomiting and nausea  Hypertension Hyperlipidemia  GERD CAD Chronic sciatica - Resume home medications once able to tolerate p.o. - Blood pressure currently well-controlled, hold statin, transition PPI to IV otherwise continue supportive care  DVT prophylaxis: heparin injection 5,000 Units Start: 12/03/23 1400 Code Status: Full Family Communication: Sister over the phone Status is: Inpatient  Dispo: The patient is from: Home              Anticipated d/c is to: Home              Anticipated d/c date is: 48 to 72 hours              Patient currently not medically stable for discharge given need for ongoing imaging, possible intervention  with general surgery as above  Consultants:  General Surgery  Procedures:  None planned   Past Medical History:  Diagnosis Date   CAD in native artery 12/15/2020   Constipation    Essential hypertension 12/15/2020   GERD (gastroesophageal reflux disease)    Hypertension    Pure hypercholesterolemia 12/15/2020   Sciatica     Past Surgical History:  Procedure Laterality Date   ROTATOR CUFF REPAIR       reports that she has never smoked. She has never used smokeless tobacco. She reports that she does not currently use alcohol. She reports that she does not use drugs.  Allergies  Allergen Reactions   Contrast Media [Iodinated Contrast Media]     Itching    Omeprazole Hives   Penicillins     Hives Hives     Family History  Problem Relation Age of Onset   Heart attack Mother 51   Heart failure Mother    Stroke Mother    Diabetes Mother    Pancreatitis Mother    Heart disease Maternal Uncle     Prior to Admission medications   Medication Sig Start Date End Date Taking? Authorizing Provider  amLODipine (NORVASC) 10 MG tablet Take 10 mg by mouth daily. Patient not taking: Reported on 11/15/2023    [provider]  Ascorbic Acid 500 MG CHEW Vitamin C 500 mg chewable tablet  Take by oral route.    [provider]  celecoxib (CELEBREX) 200 MG capsule Take 200 mg by mouth as needed. Patient not taking: Reported on 11/15/2023 01/13/20   [provider]  cetirizine (ZYRTEC) 10 MG chewable tablet Chew 10 mg by mouth daily.    [provider]  Cetirizine HCl (ZYRTEC ALLERGY) 10 MG CAPS Take 10 mg by mouth as needed.    [provider]  Cholecalciferol 50 MCG (2000 UT) CAPS Vitamin D3 50 mcg (2,000 unit) capsule  Take by oral route.    [provider]  clindamycin (CLEOCIN T) 1 % lotion clindamycin 1 % lotion Patient not taking: Reported on 11/15/2023    [provider]  famotidine (PEPCID) 40 MG tablet Take 40 mg  by mouth daily.    [provider]  fluticasone (FLONASE) 50 MCG/ACT nasal spray Place into both nostrils daily.    [provider]  fluticasone furoate-vilanterol (BREO ELLIPTA) 100-25 MCG/ACT AEPB SMARTSIG:1 Puff(s) Via Inhaler Daily PRN 10/04/20   [provider]  hydrochlorothiazide (HYDRODIURIL) 25 MG tablet Take 25 mg by mouth every morning. 03/29/20   [provider]  lidocaine  (LIDODERM ) 5 % Place 1 patch onto the skin daily. Remove & Discard patch within 12 hours or as directed by MD 03/13/22   Horton, Charmaine FALCON, MD  LORazepam  (ATIVAN ) 1 MG tablet Take 1 tablet (1 mg total) by mouth every 8 (eight) hours as needed (muscle spasms). 03/12/20   Molpus, John, MD  methylPREDNISolone  (MEDROL  DOSEPAK) 4 MG TBPK tablet Take as directed on packet. Patient not taking: Reported on 11/15/2023 03/13/22   Horton, Charmaine FALCON, MD  metoprolol  tartrate (LOPRESSOR ) 100 MG tablet TAKE 1 TABLET 2 HOURS PRIOR TO CT 12/15/20   Raford Riggs, MD  metroNIDAZOLE (METROCREAM) 0.75 % cream SMARTSIG:1 Topical Every Night 01/28/20   [provider]  Multiple Vitamin (MULTIVITAMIN ADULT PO) multivitamin    [provider]  naproxen  (NAPROSYN ) 500 MG tablet Take 1 tablet (500 mg total) by mouth 2 (two) times daily with a meal. Patient not taking: Reported on 11/15/2023 03/19/20   Cleotilde Rogue, MD  nystatin-triamcinolone (MYCOLOG II) cream Apply topically. 11/25/19   [provider]  oxyCODONE -acetaminophen  (PERCOCET/ROXICET) 5-325 MG tablet Take 1 tablet by mouth every 6 (six) hours as needed for severe pain. Patient not taking: Reported on 11/15/2023 03/13/22   Horton, Charmaine FALCON, MD  pantoprazole (PROTONIX) 40 MG tablet Take 40 mg by mouth daily. 11/25/20   [provider]  predniSONE  (DELTASONE ) 50 MG tablet Take 1 tablet 13 hours prior to test, then one 7 hours prior to test, and then one 1 hour prior to test Patient not taking: Reported on 11/15/2023  12/15/20   Raford Riggs, MD  Probiotic Product (PROBIOTIC-10 PO) Breo Ellipta 100 mcg-25 mcg/dose powder for inhalation  INHALE 1 PUFF BY MOUTH EVERY DAY    [provider]  rosuvastatin (CRESTOR) 10 MG tablet Take 10 mg by mouth daily. 10/09/20   [provider]    Physical Exam: Vitals:   12/03/23 0200 12/03/23 0215 12/03/23 0245 12/03/23 0430  BP: (!) 150/92   132/79  Pulse: 86 86 91 84  Resp: 18 18 14 16   Temp:    (!) 97.4 F (36.3 C)  TempSrc:    Oral  SpO2: 90% 90% 92% 94%  Weight:      Height:        Constitutional: NAD, calm, comfortable Vitals:   12/03/23 0200 12/03/23 0215 12/03/23 0245 12/03/23 0430  BP: (!) 150/92   132/79  Pulse: 86 86 91 84  Resp: 18 18 14 16   Temp:    (!) 97.4 F (36.3 C)  TempSrc:  Oral  SpO2: 90% 90% 92% 94%  Weight:      Height:       General:  Pleasantly resting in bed, No acute distress. HEENT:  Normocephalic atraumatic.  Sclerae nonicteric, noninjected.  Extraocular movements intact bilaterally. Neck:  Without mass or deformity.  Trachea is midline. Lungs:  Clear to auscultate bilaterally without rhonchi, wheeze, or rales. Heart:  Regular rate and rhythm.  Without murmurs, rubs, or gallops. Abdomen: Soft, diffusely tender without PMI, without rebound or guarding. Extremities: Without cyanosis, clubbing, edema, or obvious deformity. Skin:  Warm and dry, no erythema.  Labs on Admission: I have personally reviewed following labs and imaging studies  CBC: Recent Labs  Lab 12/02/23 1918  WBC 9.8  HGB 14.8  HCT 43.9  MCV 92.2  PLT 231   Basic Metabolic Panel: Recent Labs  Lab 12/02/23 1918  NA 138  K 3.8  CL 99  CO2 25  GLUCOSE 106*  BUN 8  CREATININE 0.61  CALCIUM 10.4*   GFR: Estimated Creatinine Clearance: 66 mL/min (by C-G formula based on SCr of 0.61 mg/dL).  Liver Function Tests: Recent Labs  Lab 12/02/23 1918  AST 27  ALT 12  ALKPHOS 136*  BILITOT 0.6  PROT 7.9  ALBUMIN 4.6    Recent Labs  Lab 12/02/23 1918  LIPASE 18   Urine analysis:    Component Value Date/Time   COLORURINE YELLOW 12/02/2023 1918   APPEARANCEUR CLEAR 12/02/2023 1918   LABSPEC <=1.005 12/02/2023 1918   PHURINE 6.5 12/02/2023 1918   GLUCOSEU NEGATIVE 12/02/2023 1918   HGBUR NEGATIVE 12/02/2023 1918   BILIRUBINUR NEGATIVE 12/02/2023 1918   KETONESUR 15 (A) 12/02/2023 1918   PROTEINUR NEGATIVE 12/02/2023 1918   NITRITE NEGATIVE 12/02/2023 1918   LEUKOCYTESUR SMALL (A) 12/02/2023 1918    Radiological Exams on Admission: CT ABDOMEN PELVIS WO CONTRAST Result Date: 12/02/2023 EXAM: CT ABDOMEN AND PELVIS WITHOUT CONTRAST 12/02/2023 08:33:20 PM TECHNIQUE: CT of the abdomen and pelvis was performed without the administration of intravenous contrast. Multiplanar reformatted images are provided for review. Automated exposure control, iterative reconstruction, and/or weight-based adjustment of the mA/kV was utilized to reduce the radiation dose to as low as reasonably achievable. COMPARISON: None available. CLINICAL HISTORY: Abdominal pain, acute, nonlocalized; abd pain, emesis. Nonlocalized abdominal pain, pt with a hx of bowel obstruction, pt states she feels like she has another one. Unable to have bowel movement for a few days, emesis today. FINDINGS: LOWER CHEST: No acute abnormality. LIVER: 1.9 x 1.7 cm right posterior hepatic lobe hypodense lesion with a density of 28 Hounsfield units. 1.3 x 1.1 cm fluid density lesion within the right hepatic lobe that likely represents a simple hepatic cyst. GALLBLADDER AND BILE DUCTS: Status post cholecystectomy. No biliary ductal dilatation. SPLEEN: No acute abnormality. PANCREAS: No acute abnormality. ADRENAL GLANDS: No acute abnormality. KIDNEYS, URETERS AND BLADDER: Bilateral nephrolithiasis measuring up to 3 mm on the right and punctate on the left. No ureterolithiasis bilaterally. No associated hydroureteronephrosis. No perinephric or periureteral  stranding. Urinary bladder is unremarkable. GI AND BOWEL: Stomach demonstrates no acute abnormality. Several loops of small bowel are distended with fluid within the pelvis (2:66). No frank dilatation of the small bowel. Trace mesenteric fat stranding noted within the left lower abdomen/pelvis (2:54) with associated possibly developing transition point (7:79). PERITONEUM AND RETROPERITONEUM: No ascites. No free air. VASCULATURE: Aorta is normal in caliber. At least moderate atherosclerotic plaque of the aorta and its branches. LYMPH NODES: No lymphadenopathy. REPRODUCTIVE ORGANS: No  acute abnormality. BONES AND SOFT TISSUES: Multilevel mild degenerative changes of the spine with disc bulge at the L3-L4, L4-L5, L5-S1 level. Multilevel intervertebral disc space vacuum phenomenon. No acute osseous abnormality. No focal soft tissue abnormality. IMPRESSION: 1. Question developing/early small bowel obstruction with small bowel fluid distention, mild mesenteric fat stranding in the setting of a likely transition point within the left pelvis. 2. Nonobstructive bilateral nephrolithiasis. 3. Indeterminate 1.9 x 1.7 cm right posterior hepatic lobe hypodense lesion. Limited evaluation of this noncontrast study. Electronically signed by: Morgane Naveau MD 12/02/2023 08:48 PM EDT RP Workstation: [id number]    EKG: Independently reviewed.    Elsie JAYSON Montclair DO [facility] For contact please use secure messenger on Epic  If 7PM-7AM, please contact night-coverage located on [email or web address]   12/03/2023, 7:23 AM

## 2023-12-03 NOTE — Plan of Care (Signed)
 Plan of Care Note for accepted transfer  Patient: Samantha Bright    FMW:[id number]  DOA: 12/02/2023     Facility requesting transfer: [facility] ED  Requesting Provider: Darra Fonda MATSU, MD  Reason for transfer: Partial SBO  Facility course:   18 F with hx CAD, HTN, HLD, GERD, falls, sciatica, Presenting with worsening abd pain. No BM in few days. No vomiting at this time. VS reassuring. Chem / blood counts unremarkable, UA + ketone, doubt infection. CT A/P with possible developing / early SBO with transition in the L pelvis. EDP spoke with Dr. Ebbie Ham surg, they will consult / follow along. Had planned for NG but patient preferred to hold off for now, but understands may need if vomiting; none in right now. Ordered for NPO except sip / ice chip, mIVF, pain meds, antiemetics prn.     Plan of care: The patient is accepted for admission to Telemetry unit, at [facility].    Author: Dorn Dawson, MD  12/03/2023  Check [email or web address] for on-call coverage.  Nursing staff, Please call [facility] Admits & Consults System-Wide number on Amion as soon as patient's arrival, so appropriate admitting provider can evaluate the pt.

## 2023-12-03 NOTE — Consult Note (Signed)
 Samantha Bright [birth year]  [id number].    Requesting MD: Dr. Elsie Montclair Chief Complaint/Reason for Consult: SBO  HPI: Samantha Bright is a 68 y.o. female with history of CAD, hypertension, hyperlipidemia, GERD who presented to the to the ED for abdominal pain.  Patient reports on Sunday after drinking a green tea and eating a muffin she began having epigastric abdominal pain with radiation to her lower abdomen with associated reflux, nausea and vomiting.  She initially thought this was her reflux and tried her home reflux medication without relief. She reports her last bowel movement was on Friday and has not been passing flatus.  She reports history of intermittent constipation and has to take a stool softener daily for this.  She reported her symptoms felt like when she had a bowel obstruction 20 years ago in [place] prompting her visit to the ED.  She reports when she had SBO 20 years ago, she did not require surgery and this improved with conservative management.  She has a history of prior umbilical hernia repair as a child, cholecystectomy and abdominal hysterectomy.  Her workup was concerning for SBO.  She was admitted to [facility] and general surgery was asked to see.  She reports her last episode of vomiting was this morning when she was getting transferred to the bed.  She currently reports her abdominal pain has improved since admission and her nausea has resolved.  She still feels bloated.  No flatus or BM since admission.  She is not on any blood thinners.  ROS: ROS As above, see hpi  Family History  Problem Relation Age of Onset   Heart attack Mother 6   Heart failure Mother    Stroke Mother    Diabetes Mother    Pancreatitis Mother    Heart disease Maternal Uncle     Past Medical History:  Diagnosis Date   CAD in native artery 12/15/2020   Constipation    Essential hypertension 12/15/2020   GERD (gastroesophageal reflux disease)    Hypertension    Pure  hypercholesterolemia 12/15/2020   Sciatica     Past Surgical History:  Procedure Laterality Date   ROTATOR CUFF REPAIR      Social History:  reports that she has never smoked. She has never used smokeless tobacco. She reports that she does not currently use alcohol. She reports that she does not use drugs.  Allergies:  Allergies  Allergen Reactions   Contrast Media [Iodinated Contrast Media] Itching   Omeprazole Hives   Penicillins Hives         Medications Prior to Admission  Medication Sig Dispense Refill   albuterol (VENTOLIN HFA) 108 (90 Base) MCG/ACT inhaler SMARTSIG:1 Puff(s) Via Inhaler Every 4 Hours PRN     clobetasol (TEMOVATE) 0.05 % external solution APPLY TWICE DAILY TO SCALP X1-2 WKS AS NEEDED FOR FLARES, THEN WEEKLY AS MAINTENANCE     triamcinolone cream (KENALOG) 0.1 % Apply topically.     amLODipine (NORVASC) 10 MG tablet Take 10 mg by mouth daily. (Patient not taking: Reported on 11/15/2023)     Ascorbic Acid 500 MG CHEW Vitamin C 500 mg chewable tablet  Take by oral route.     celecoxib (CELEBREX) 200 MG capsule Take 200 mg by mouth as needed. (Patient not taking: Reported on 11/15/2023)     cetirizine (ZYRTEC) 10 MG chewable tablet Chew 10 mg by mouth daily.     Cetirizine HCl (ZYRTEC ALLERGY) 10 MG CAPS Take 10 mg by  mouth as needed.     Cholecalciferol 50 MCG (2000 UT) CAPS Vitamin D3 50 mcg (2,000 unit) capsule  Take by oral route.     clindamycin (CLEOCIN T) 1 % lotion clindamycin 1 % lotion (Patient not taking: Reported on 11/15/2023)     famotidine (PEPCID) 40 MG tablet Take 40 mg by mouth daily.     fluticasone (FLONASE) 50 MCG/ACT nasal spray Place into both nostrils daily.     fluticasone furoate-vilanterol (BREO ELLIPTA) 100-25 MCG/ACT AEPB SMARTSIG:1 Puff(s) Via Inhaler Daily PRN     hydrochlorothiazide (HYDRODIURIL) 25 MG tablet Take 25 mg by mouth every morning.     lidocaine  (LIDODERM ) 5 % Place 1 patch onto the skin daily. Remove & Discard patch  within 12 hours or as directed by MD 15 patch 0   LORazepam  (ATIVAN ) 1 MG tablet Take 1 tablet (1 mg total) by mouth every 8 (eight) hours as needed (muscle spasms). 15 tablet 0   methylPREDNISolone  (MEDROL  DOSEPAK) 4 MG TBPK tablet Take as directed on packet. (Patient not taking: Reported on 11/15/2023) 21 tablet 0   metoprolol  tartrate (LOPRESSOR ) 100 MG tablet TAKE 1 TABLET 2 HOURS PRIOR TO CT 1 tablet 0   metroNIDAZOLE (METROCREAM) 0.75 % cream SMARTSIG:1 Topical Every Night     Multiple Vitamin (MULTIVITAMIN ADULT PO) multivitamin     naproxen  (NAPROSYN ) 500 MG tablet Take 1 tablet (500 mg total) by mouth 2 (two) times daily with a meal. (Patient not taking: Reported on 11/15/2023) 30 tablet 0   nystatin-triamcinolone (MYCOLOG II) cream Apply topically.     oxyCODONE -acetaminophen  (PERCOCET/ROXICET) 5-325 MG tablet Take 1 tablet by mouth every 6 (six) hours as needed for severe pain. (Patient not taking: Reported on 11/15/2023) 10 tablet 0   pantoprazole (PROTONIX) 40 MG tablet Take 40 mg by mouth daily.     predniSONE  (DELTASONE ) 50 MG tablet Take 1 tablet 13 hours prior to test, then one 7 hours prior to test, and then one 1 hour prior to test (Patient not taking: Reported on 11/15/2023) 3 tablet 0   Probiotic Product (PROBIOTIC-10 PO) Breo Ellipta 100 mcg-25 mcg/dose powder for inhalation  INHALE 1 PUFF BY MOUTH EVERY DAY     rosuvastatin (CRESTOR) 10 MG tablet Take 10 mg by mouth daily.       Physical Exam: Blood pressure 132/79, pulse 84, temperature (!) 97.4 F (36.3 C), temperature source Oral, resp. rate 16, height 5' 8 (1.727 m), weight 74.8 kg, SpO2 94%. General: pleasant, WD/WN female who is laying in bed in NAD HEENT: head is normocephalic, atraumatic.  Sclera are non-icteric.  Heart: regular, rate, and rhythm.   Lungs: Respiratory effort nonlabored Abd:  Soft, mild distension, generalized ttp that is greatest in the lower abdomen without rigidity or guarding. No masses,  hernias, or organomegaly. Prior abdominal scars noted and well healed.  MS: no BUE edema Skin: warm and dry  Psych: A&Ox4 with an appropriate affect Neuro: normal speech, thought process intact, gait not assessed  Results for orders placed or performed during the hospital encounter of 12/02/23 (from the past 48 hours)  Lipase, blood     Status: None   Collection Time: 12/02/23  7:18 PM  Result Value Ref Range   Lipase 18 11 - 51 U/L    Comment: Performed at [facility], [address], [place], [place] [address]  Comprehensive metabolic panel     Status: Abnormal   Collection Time: 12/02/23  7:18 PM  Result Value Ref Range   Sodium 138 135 - 145 mmol/L   Potassium 3.8 3.5 - 5.1 mmol/L   Chloride 99 98 - 111 mmol/L   CO2 25 22 - 32 mmol/L   Glucose, Bld 106 (H) 70 - 99 mg/dL    Comment: Glucose reference range applies only to samples taken after fasting for at least 8 hours.   BUN 8 8 - 23 mg/dL   Creatinine, Ser 9.38 0.44 - 1.00 mg/dL   Calcium 89.5 (H) 8.9 - 10.3 mg/dL   Total Protein 7.9 6.5 - 8.1 g/dL   Albumin 4.6 3.5 - 5.0 g/dL   AST 27 15 - 41 U/L   ALT 12 0 - 44 U/L   Alkaline Phosphatase 136 (H) 38 - 126 U/L   Total Bilirubin 0.6 0.0 - 1.2 mg/dL   GFR, Estimated >39 >39 mL/min    Comment: (NOTE) Calculated using the CKD-EPI Creatinine Equation (2021)    Anion gap 14 5 - 15    Comment: Performed at [facility], [address] [place], [place] [address]  CBC     Status: None   Collection Time: 12/02/23  7:18 PM  Result Value Ref Range   WBC 9.8 4.0 - 10.5 K/uL   RBC 4.76 3.87 - 5.11 MIL/uL   Hemoglobin 14.8 12.0 - 15.0 g/dL   HCT 56.0 63.9 - 53.9 %   MCV 92.2 80.0 - 100.0 fL   MCH 31.1 26.0 - 34.0 pg   MCHC 33.7 30.0 - 36.0 g/dL   RDW 87.9 88.4 - 84.4 %   Platelets 231 150 - 400 K/uL   nRBC 0.0 0.0 - 0.2 %    Comment: Performed at [facility], [address] [place], [place] [address]  Urinalysis, Routine w reflex  microscopic -Urine, Clean Catch     Status: Abnormal   Collection Time: 12/02/23  7:18 PM  Result Value Ref Range   Color, Urine YELLOW YELLOW   APPearance CLEAR CLEAR   Specific Gravity, Urine <=1.005 1.005 - 1.030   pH 6.5 5.0 - 8.0   Glucose, UA NEGATIVE NEGATIVE mg/dL   Hgb urine dipstick NEGATIVE NEGATIVE   Bilirubin Urine NEGATIVE NEGATIVE   Ketones, ur 15 (A) NEGATIVE mg/dL   Protein, ur NEGATIVE NEGATIVE mg/dL   Nitrite NEGATIVE NEGATIVE   Leukocytes,Ua SMALL (A) NEGATIVE    Comment: Performed at [facility], [address] [place], [place] [address]  Urinalysis, Microscopic (reflex)     Status: Abnormal   Collection Time: 12/02/23  7:18 PM  Result Value Ref Range   RBC / HPF NONE SEEN 0 - 5 RBC/hpf   WBC, UA 6-10 0 - 5 WBC/hpf   Bacteria, UA RARE (A) NONE SEEN   Squamous Epithelial / HPF 0-5 0 - 5 /HPF    Comment: Performed at [facility], [address] [place], [place] [address]  CBC     Status: None   Collection Time: 12/03/23  8:01 AM  Result Value Ref Range   WBC 9.0 4.0 - 10.5 K/uL   RBC 4.56 3.87 - 5.11 MIL/uL   Hemoglobin 13.7 12.0 - 15.0 g/dL   HCT 55.7 63.9 - 53.9 %   MCV 96.9 80.0 - 100.0 fL   MCH 30.0 26.0 - 34.0 pg   MCHC 31.0 30.0 - 36.0 g/dL   RDW 87.7 88.4 - 84.4 %   Platelets 222 150 - 400  K/uL   nRBC 0.0 0.0 - 0.2 %    Comment: Performed at [facility], [address], [place], [place] [address]  Creatinine, serum     Status: None   Collection Time: 12/03/23  8:01 AM  Result Value Ref Range   Creatinine, Ser 0.58 0.44 - 1.00 mg/dL   GFR, Estimated >39 >39 mL/min    Comment: (NOTE) Calculated using the CKD-EPI Creatinine Equation (2021) Performed at [facility], [address] [place], [place] [address]    CT ABDOMEN PELVIS WO CONTRAST Result Date: 12/02/2023 EXAM: CT ABDOMEN AND PELVIS WITHOUT CONTRAST 12/02/2023 08:33:20 PM TECHNIQUE: CT of the abdomen and pelvis was  performed without the administration of intravenous contrast. Multiplanar reformatted images are provided for review. Automated exposure control, iterative reconstruction, and/or weight-based adjustment of the mA/kV was utilized to reduce the radiation dose to as low as reasonably achievable. COMPARISON: None available. CLINICAL HISTORY: Abdominal pain, acute, nonlocalized; abd pain, emesis. Nonlocalized abdominal pain, pt with a hx of bowel obstruction, pt states she feels like she has another one. Unable to have bowel movement for a few days, emesis today. FINDINGS: LOWER CHEST: No acute abnormality. LIVER: 1.9 x 1.7 cm right posterior hepatic lobe hypodense lesion with a density of 28 Hounsfield units. 1.3 x 1.1 cm fluid density lesion within the right hepatic lobe that likely represents a simple hepatic cyst. GALLBLADDER AND BILE DUCTS: Status post cholecystectomy. No biliary ductal dilatation. SPLEEN: No acute abnormality. PANCREAS: No acute abnormality. ADRENAL GLANDS: No acute abnormality. KIDNEYS, URETERS AND BLADDER: Bilateral nephrolithiasis measuring up to 3 mm on the right and punctate on the left. No ureterolithiasis bilaterally. No associated hydroureteronephrosis. No perinephric or periureteral stranding. Urinary bladder is unremarkable. GI AND BOWEL: Stomach demonstrates no acute abnormality. Several loops of small bowel are distended with fluid within the pelvis (2:66). No frank dilatation of the small bowel. Trace mesenteric fat stranding noted within the left lower abdomen/pelvis (2:54) with associated possibly developing transition point (7:79). PERITONEUM AND RETROPERITONEUM: No ascites. No free air. VASCULATURE: Aorta is normal in caliber. At least moderate atherosclerotic plaque of the aorta and its branches. LYMPH NODES: No lymphadenopathy. REPRODUCTIVE ORGANS: No acute abnormality. BONES AND SOFT TISSUES: Multilevel mild degenerative changes of the spine with disc bulge at the L3-L4, L4-L5,  L5-S1 level. Multilevel intervertebral disc space vacuum phenomenon. No acute osseous abnormality. No focal soft tissue abnormality. IMPRESSION: 1. Question developing/early small bowel obstruction with small bowel fluid distention, mild mesenteric fat stranding in the setting of a likely transition point within the left pelvis. 2. Nonobstructive bilateral nephrolithiasis. 3. Indeterminate 1.9 x 1.7 cm right posterior hepatic lobe hypodense lesion. Limited evaluation of this noncontrast study. Electronically signed by: Morgane Naveau MD 12/02/2023 08:48 PM EDT RP Workstation: [id number]    Anti-infectives (From admission, onward)    None       Assessment/Plan SBO - CT w/ early small bowel obstruction with small bowel fluid distention, mild mesenteric fat stranding in the setting of a likely transition point within the left pelvis. - Hx of prior umbilical hernia repair as a child, cholecystectomy and abdominal hysterectomy. Hx of SBO 20 years ago in [place] that resolved with conservative management - HDS without fever, tachycardia or hypotension. No peritonitis on exam. WBC wnl. No current indication for emergency surgery - As her nausea has resolved, I think we can hold off on NG tube at this current time.  Please keep NPO.  If she develops worsening  nausea or any vomiting would recommend NG tube placement. - Start SBO protocol orally - Keep K >=4, Phos >= 3, Mg >= 2 and mobilize for bowel function - Hopefully patient will improve with conservative management. If patient fails to improve with conservative management, they may require exploratory surgery during admission - Agree with medical admission. We will follow with you.  FEN - NPO, IVF per [facility] VTE - SCDs, okay for chem ppx from a general surgery standpoint ID - None  I reviewed nursing notes, last 24 h vitals and pain scores, last 48 h intake and output, last 24 h labs and trends, and last 24 h imaging results.   Samantha Bright,  [facility] Surgery 12/03/2023, 10:09 AM Please see Amion for pager number during day hours 7:00am-4:30pm

## 2023-12-04 ENCOUNTER — Inpatient Hospital Stay (HOSPITAL_COMMUNITY)

## 2023-12-04 DIAGNOSIS — K56600 Partial intestinal obstruction, unspecified as to cause: Secondary | ICD-10-CM | POA: Diagnosis not present

## 2023-12-04 DIAGNOSIS — J9811 Atelectasis: Secondary | ICD-10-CM | POA: Diagnosis not present

## 2023-12-04 DIAGNOSIS — Z4682 Encounter for fitting and adjustment of non-vascular catheter: Secondary | ICD-10-CM | POA: Diagnosis not present

## 2023-12-04 DIAGNOSIS — Z9049 Acquired absence of other specified parts of digestive tract: Secondary | ICD-10-CM | POA: Diagnosis not present

## 2023-12-04 DIAGNOSIS — K56609 Unspecified intestinal obstruction, unspecified as to partial versus complete obstruction: Secondary | ICD-10-CM | POA: Diagnosis not present

## 2023-12-04 LAB — CBC
HCT: 46.5 % — ABNORMAL HIGH (ref 36.0–46.0)
Hemoglobin: 14.8 g/dL (ref 12.0–15.0)
MCH: 31.4 pg (ref 26.0–34.0)
MCHC: 31.8 g/dL (ref 30.0–36.0)
MCV: 98.7 fL (ref 80.0–100.0)
Platelets: 222 K/uL (ref 150–400)
RBC: 4.71 MIL/uL (ref 3.87–5.11)
RDW: 12.4 % (ref 11.5–15.5)
WBC: 18.8 K/uL — ABNORMAL HIGH (ref 4.0–10.5)
nRBC: 0 % (ref 0.0–0.2)

## 2023-12-04 LAB — BASIC METABOLIC PANEL WITH GFR
Anion gap: 16 — ABNORMAL HIGH (ref 5–15)
BUN: 17 mg/dL (ref 8–23)
CO2: 22 mmol/L (ref 22–32)
Calcium: 9.4 mg/dL (ref 8.9–10.3)
Chloride: 106 mmol/L (ref 98–111)
Creatinine, Ser: 0.64 mg/dL (ref 0.44–1.00)
GFR, Estimated: >60 mL/min (ref >60)
Glucose, Bld: 125 mg/dL — ABNORMAL HIGH (ref 70–99)
Potassium: 3.4 mmol/L — ABNORMAL LOW (ref 3.5–5.1)
Sodium: 144 mmol/L (ref 135–145)

## 2023-12-04 LAB — MAGNESIUM: Magnesium: 2.2 mg/dL (ref 1.7–2.4)

## 2023-12-04 MED ORDER — DIATRIZOATE MEGLUMINE & SODIUM 66-10 % PO SOLN
90.0000 mL | Freq: Once | ORAL | Status: AC
  Administered 2023-12-04: 90 mL via NASOGASTRIC
  Filled 2023-12-04: qty 90

## 2023-12-04 MED ORDER — LORAZEPAM 2 MG/ML IJ SOLN
0.5000 mg | Freq: Once | INTRAMUSCULAR | Status: AC | PRN
  Administered 2023-12-04: 0.5 mg via INTRAVENOUS
  Filled 2023-12-04: qty 1

## 2023-12-04 MED ORDER — METHYLPREDNISOLONE SODIUM SUCC 40 MG IJ SOLR
40.0000 mg | Freq: Once | INTRAMUSCULAR | Status: AC
  Administered 2023-12-04: 40 mg via INTRAVENOUS
  Filled 2023-12-04: qty 1

## 2023-12-04 MED ORDER — SODIUM CHLORIDE 0.9 % IV BOLUS
500.0000 mL | Freq: Once | INTRAVENOUS | Status: AC
  Administered 2023-12-04: 500 mL via INTRAVENOUS

## 2023-12-04 MED ORDER — SODIUM CHLORIDE 0.9 % IV BOLUS
1000.0000 mL | Freq: Once | INTRAVENOUS | Status: AC
  Administered 2023-12-04: 1000 mL via INTRAVENOUS

## 2023-12-04 MED ORDER — POTASSIUM CHLORIDE IN NACL 40-0.9 MEQ/L-% IV SOLN
INTRAVENOUS | Status: DC
Start: 2023-12-04 — End: 2023-12-05
  Filled 2023-12-04: qty 1000

## 2023-12-04 MED ORDER — SODIUM CHLORIDE 0.9 % IV SOLN
25.0000 mg | Freq: Once | INTRAVENOUS | Status: AC
  Administered 2023-12-04: 25 mg via INTRAVENOUS
  Filled 2023-12-04: qty 0.5
  Filled 2023-12-04: qty 25

## 2023-12-04 MED ORDER — PHENOL 1.4 % MT LIQD
1.0000 | OROMUCOSAL | Status: DC | PRN
  Administered 2023-12-04: 1 via OROMUCOSAL
  Filled 2023-12-04: qty 177

## 2023-12-04 NOTE — TOC Initial Note (Signed)
 Transition of Care [facility]) - Initial/Assessment Note    Patient Details  Name: Samantha Bright MRN: [id number] Date of Birth: [birth year]  Transition [facility]) CM/SW Contact:    Bascom Service, RN Phone Number: 12/04/2023, 3:44 PM  Clinical Narrative:d/c plan home. Has own transport home.                   Expected Discharge Plan: Home/Self Care Barriers to Discharge: Continued Medical Work up   Patient Goals and CMS Choice Patient states their goals for this hospitalization and ongoing recovery are:: Home CMS Medicare.gov Compare Post Acute Care list provided to:: Patient Choice offered to / list presented to : Patient [place] ownership interest in [facility].provided to:: Patient    Expected Discharge Plan and Services   Discharge Planning Services: CM Consult   Living arrangements for the past 2 months: Single Family Home                                      Prior Living Arrangements/Services Living arrangements for the past 2 months: Single Family Home Lives with:: Adult Children   Do you feel safe going back to the place where you live?: Yes               Activities of Daily Living   ADL Screening (condition at time of admission) Independently performs ADLs?: Yes (appropriate for developmental age) Is the patient deaf or have difficulty hearing?: No Does the patient have difficulty seeing, even when wearing glasses/contacts?: No Does the patient have difficulty concentrating, remembering, or making decisions?: No  Permission Sought/Granted Permission sought to share information with : Case Manager Permission granted to share information with : Yes, Verbal Permission Granted              Emotional Assessment              Admission diagnosis:  SBO (small bowel obstruction) (HCC) [K56.609] Partial small bowel obstruction (HCC) [K56.600] Patient Active Problem List   Diagnosis Date Noted   Partial small bowel obstruction  (HCC) 12/03/2023   SBO (small bowel obstruction) (HCC) 12/03/2023   CAD in native artery 12/15/2020   Essential hypertension 12/15/2020   Pure hypercholesterolemia 12/15/2020   PCP:  Chrystal Lamarr RAMAN, MD Pharmacy:   [facility] [phone] - [place], [place] - [address] AT [address] [place] [address] Phone: [phone] Fax: [phone]     Social Drivers of Health (SDOH) Social History: SDOH Screenings   Food Insecurity: No Food Insecurity (12/03/2023)  Housing: Low Risk  (12/03/2023)  Transportation Needs: No Transportation Needs (12/03/2023)  Utilities: Not At Risk (12/03/2023)  Alcohol Screen: Low Risk  (12/15/2020)  Financial Resource Strain: Low Risk  (12/15/2020)  Physical Activity: Inactive (12/15/2020)  Social Connections: Moderately Integrated (12/03/2023)  Tobacco Use: Low Risk  (12/02/2023)   SDOH Interventions:     Readmission Risk Interventions     No data to display

## 2023-12-04 NOTE — Progress Notes (Signed)
 PROGRESS NOTE    Samantha Bright  FMW:[id number] DOB: May 19, 1955 DOA: 12/02/2023 PCP: Chrystal Lamarr RAMAN, MD   Brief Narrative:  Samantha Bright is a 68 y.o. female who presents with worsening abdominal pain and reports no bowel movement in 4+ days. Patient has known medical history of hypertension hyperlipidemia GERD, CAD, recurrent mechanical falls and chronic sciatica with history of multiple abdominal surgeries and previous small bowel obstruction.  Imaging at intake concerning for possible early small bowel obstruction with transition point of the left pelvis, given patient's medical history, inability to tolerate p.o. due to nausea and profound constipation hospitalist was called for admission.  Dr. Ebbie with general surgery was called in consult.    Assessment & Plan:   Principal Problem:   Partial small bowel obstruction (HCC) Active Problems:   SBO (small bowel obstruction) (HCC)  Small bowel obstruction early versus partial, POA - General Surgery following, appreciate insight recommendations - Gastrografin study without obvious contrast in the colon - Patient clinically worsening over the past 24 hours, NG tube and intermittent suction with worsening abdominal distention and pain - Ice chips sparingly, otherwise n.p.o.   Urinary obstruction, likely secondary to above  - Foley placed given elevated bladder scan with inability to urinate.  Concern there is some aspect of compression given above  Tachycardia - Likely secondary to above discomfort/obstruction -improving with supportive care/fluids  Leukocytosis - Likely reactive given above - remains afebrile - no clear source or presumed infection - Could consider GI translocation if worsening leukocytosis/fever  Hypertension Hyperlipidemia  GERD CAD Chronic sciatica - Resume home medications once able to tolerate p.o. - Blood pressure currently well-controlled, hold statin, transition PPI to IV otherwise  continue supportive care   DVT prophylaxis: heparin injection 5,000 Units Start: 12/03/23 1400 Code Status:   Code Status: Full Code Family Communication: At bedside, multiple  Status is: inpt  Dispo: The patient is from: Home              Anticipated d/c is to: TBD              Anticipated d/c date is: TBD              Patient currently NOT medically stable for discharge  Consultants:  General Surgery  Procedures:  None planned at this time  Antimicrobials:  None   Subjective: Overnight worsening abdominal distention nausea vomiting, NG tube placed.  Otherwise denies chest pain headache fevers chills shortness of breath.  Objective: Vitals:   12/03/23 2225 12/03/23 2325 12/04/23 0335 12/04/23 0651  BP: (!) 143/100 (!) 147/98 (!) 140/85   Pulse: (!) 122 (!) 124 (!) 137   Resp: 18 20 20    Temp: 98.9 F (37.2 C) 99.3 F (37.4 C) 98.1 F (36.7 C) (!) 97.4 F (36.3 C)  TempSrc:  Oral Oral Oral  SpO2: 100% 100% 98%   Weight:      Height:        Intake/Output Summary (Last 24 hours) at 12/04/2023 0759 Last data filed at 12/04/2023 0510 Gross per 24 hour  Intake 2591.71 ml  Output 2000 ml  Net 591.71 ml   Filed Weights   12/02/23 1915  Weight: 74.8 kg    Examination:  General:  Pleasantly resting in bed, No acute distress. HEENT:  Normocephalic atraumatic.  NG tube intact Neck:  Without mass or deformity.  Trachea is midline. Lungs:  Clear to auscultate bilaterally without rhonchi, wheeze, or rales. Heart:  Regular rate and rhythm.  Without murmurs, rubs, or gallops. Abdomen: Soft, distended, diffusely tender GU: Foley catheter in place Extremities: Without cyanosis, clubbing, edema, or obvious deformity. Skin:  Warm and dry, no erythema.  Data Reviewed: I have personally reviewed following labs and imaging studies  CBC: Recent Labs  Lab 12/02/23 1918 12/03/23 0801 12/04/23 0550  WBC 9.8 9.0 18.8*  HGB 14.8 13.7 14.8  HCT 43.9 44.2 46.5*  MCV  92.2 96.9 98.7  PLT 231 222 222   Basic Metabolic Panel: Recent Labs  Lab 12/02/23 1918 12/03/23 0801 12/04/23 0550  NA 138  --  144  K 3.8  --  3.4*  CL 99  --  106  CO2 25  --  22  GLUCOSE 106*  --  125*  BUN 8  --  17  CREATININE 0.61 0.58 0.64  CALCIUM 10.4*  --  9.4  MG  --   --  2.2   GFR: Estimated Creatinine Clearance: 66 mL/min (by C-G formula based on SCr of 0.64 mg/dL). Liver Function Tests: Recent Labs  Lab 12/02/23 1918  AST 27  ALT 12  ALKPHOS 136*  BILITOT 0.6  PROT 7.9  ALBUMIN 4.6   Recent Labs  Lab 12/02/23 1918  LIPASE 18   No results found for this or any previous visit (from the past 240 hours).   Radiology Studies: DG Abd 1 View Result Date: 12/04/2023 EXAM: 1 VIEW XRAY OF THE ABDOMEN 12/04/2023 02:05:00 AM COMPARISON: 12/03/2023 CLINICAL HISTORY: Encounter for imaging study to confirm nasogastric (NG) tube placement. FINDINGS: LINES, TUBES AND DEVICES: Enteric tube in place with distal tip and side port terminating within the expected location of the gastric body. BOWEL: Nonobstructive bowel gas pattern. SOFT TISSUES: Right upper quadrant surgical clips noted. No opaque urinary calculi. BONES: S shaped thoracolumbar scoliosis. LUNGS: Left lung base atelectasis. IMPRESSION: 1. Enteric tube appropriately positioned with distal tip and side port in the gastric body. Electronically signed by: Pinkie Pebbles MD 12/04/2023 02:13 AM EDT RP Workstation: [id number]   DG Abd Portable 1V-Small Bowel Obstruction Protocol-initial, 8 hr delay Result Date: 12/03/2023 CLINICAL DATA:  Small bowel obstruction EXAM: PORTABLE ABDOMEN - 1 VIEW COMPARISON:  CT 12/02/2023 FINDINGS: Oral contrast material noted within a distended stomach. Large stool burden within the colon. No small bowel dilatation noted on this single image. No free air or organomegaly. IMPRESSION: Contrast material noted within a distended stomach. Small bowel not visualized. Large stool burden in  the colon. Electronically Signed   By: Franky Crease M.D.   On: 12/03/2023 23:18   CT ABDOMEN PELVIS WO CONTRAST Result Date: 12/02/2023 EXAM: CT ABDOMEN AND PELVIS WITHOUT CONTRAST 12/02/2023 08:33:20 PM TECHNIQUE: CT of the abdomen and pelvis was performed without the administration of intravenous contrast. Multiplanar reformatted images are provided for review. Automated exposure control, iterative reconstruction, and/or weight-based adjustment of the mA/kV was utilized to reduce the radiation dose to as low as reasonably achievable. COMPARISON: None available. CLINICAL HISTORY: Abdominal pain, acute, nonlocalized; abd pain, emesis. Nonlocalized abdominal pain, pt with a hx of bowel obstruction, pt states she feels like she has another one. Unable to have bowel movement for a few days, emesis today. FINDINGS: LOWER CHEST: No acute abnormality. LIVER: 1.9 x 1.7 cm right posterior hepatic lobe hypodense lesion with a density of 28 Hounsfield units. 1.3 x 1.1 cm fluid density lesion within the right hepatic lobe that likely represents a simple hepatic cyst. GALLBLADDER AND BILE DUCTS: Status post cholecystectomy. No biliary ductal dilatation. SPLEEN:  No acute abnormality. PANCREAS: No acute abnormality. ADRENAL GLANDS: No acute abnormality. KIDNEYS, URETERS AND BLADDER: Bilateral nephrolithiasis measuring up to 3 mm on the right and punctate on the left. No ureterolithiasis bilaterally. No associated hydroureteronephrosis. No perinephric or periureteral stranding. Urinary bladder is unremarkable. GI AND BOWEL: Stomach demonstrates no acute abnormality. Several loops of small bowel are distended with fluid within the pelvis (2:66). No frank dilatation of the small bowel. Trace mesenteric fat stranding noted within the left lower abdomen/pelvis (2:54) with associated possibly developing transition point (7:79). PERITONEUM AND RETROPERITONEUM: No ascites. No free air. VASCULATURE: Aorta is normal in caliber. At  least moderate atherosclerotic plaque of the aorta and its branches. LYMPH NODES: No lymphadenopathy. REPRODUCTIVE ORGANS: No acute abnormality. BONES AND SOFT TISSUES: Multilevel mild degenerative changes of the spine with disc bulge at the L3-L4, L4-L5, L5-S1 level. Multilevel intervertebral disc space vacuum phenomenon. No acute osseous abnormality. No focal soft tissue abnormality. IMPRESSION: 1. Question developing/early small bowel obstruction with small bowel fluid distention, mild mesenteric fat stranding in the setting of a likely transition point within the left pelvis. 2. Nonobstructive bilateral nephrolithiasis. 3. Indeterminate 1.9 x 1.7 cm right posterior hepatic lobe hypodense lesion. Limited evaluation of this noncontrast study. Electronically signed by: Morgane Naveau MD 12/02/2023 08:48 PM EDT RP Workstation: [id number]   Scheduled Meds:  heparin  5,000 Units Subcutaneous Q8H   Continuous Infusions:  sodium chloride 100 mL/hr at 12/04/23 0026     LOS: 1 day   Time spent:  Elsie JAYSON [place], DO [facility]  If 7PM-7AM, please contact night-coverage [email or web address]  12/04/2023, 7:59 AM

## 2023-12-04 NOTE — Progress Notes (Signed)
   12/03/23 2143  Assess: MEWS Score  Temp 99 F (37.2 C)  BP (!) 152/96  Pulse Rate (!) 130  Resp (!) 22  Level of Consciousness Alert  SpO2 93 %  O2 Device Nasal Cannula  O2 Flow Rate (L/min) 2 L/min  Assess: MEWS Score  MEWS Temp 0  MEWS Systolic 0  MEWS Pulse 3  MEWS RR 1  MEWS LOC 0  MEWS Score 4  MEWS Score Color Red  Assess: if the MEWS score is Yellow or Red  Were vital signs accurate and taken at a resting state? Yes  Does the patient meet 2 or more of the SIRS criteria? Yes  Does the patient have a confirmed or suspected source of infection? Yes  MEWS guidelines implemented  Yes, red  Treat  MEWS Interventions Considered administering scheduled or prn medications/treatments as ordered  Take Vital Signs  Increase Vital Sign Frequency  Red: Q1hr x2, continue Q4hrs until patient remains green for 12hrs  Escalate  MEWS: Escalate Red: Discuss with charge nurse and notify provider. Consider notifying RRT. If remains red for 2 hours consider need for higher level of care  Notify: Charge Nurse/RN  Name of Charge Nurse/RN Notified maria  Provider Notification  Provider Name/Title Blondie  Date Provider Notified 12/03/23  Time Provider Notified 2150  Method of Notification Page  Notification Reason Change in status  Provider response At bedside  Date of Provider Response 12/03/23  Time of Provider Response 2200  Notify: Rapid Response  Name of Rapid Response RN Notified Timber  Date Rapid Response Notified 12/03/23  Time Rapid Response Notified 2150  Assess: SIRS CRITERIA  SIRS Temperature  0  SIRS Respirations  1  SIRS Pulse 1  SIRS WBC 0  SIRS Score Sum  2

## 2023-12-04 NOTE — Plan of Care (Addendum)
  1115 noted right arm swelling. NS bolus ongoing. MD at bedside, aware of this infiltration. Elevated right arm with pillows. Educated pt and family. Verbalized understanding.  Waiting for IV team to start  new IV.   1118 Rph acknowledged report of IV infiltration. No further recs.   Problem: Education: Goal: Knowledge of General Education information will improve Description: Including pain rating scale, medication(s)/side effects and non-pharmacologic comfort measures Outcome: Progressing   Problem: Health Behavior/Discharge Planning: Goal: Ability to manage health-related needs will improve Outcome: Progressing   Problem: Clinical Measurements: Goal: Ability to maintain clinical measurements within normal limits will improve Outcome: Progressing Goal: Will remain free from infection Outcome: Progressing Goal: Diagnostic test results will improve Outcome: Progressing Goal: Respiratory complications will improve Outcome: Progressing Goal: Cardiovascular complication will be avoided Outcome: Progressing   Problem: Activity: Goal: Risk for activity intolerance will decrease Outcome: Progressing   Problem: Nutrition: Goal: Adequate nutrition will be maintained Outcome: Progressing   Problem: Coping: Goal: Level of anxiety will decrease Outcome: Progressing   Problem: Elimination: Goal: Will not experience complications related to bowel motility Outcome: Progressing Goal: Will not experience complications related to urinary retention Outcome: Progressing   Problem: Pain Managment: Goal: General experience of comfort will improve and/or be controlled Outcome: Progressing   Problem: Safety: Goal: Ability to remain free from injury will improve Outcome: Progressing   Problem: Skin Integrity: Goal: Risk for impaired skin integrity will decrease Outcome: Progressing

## 2023-12-04 NOTE — Progress Notes (Signed)
 NG 16g inserted left nare at 0120  2000cc bilious drainage within 5-10 minutes  tolerated procedure well  awaiting placement confiramation

## 2023-12-04 NOTE — Progress Notes (Signed)
   12/04/23 1408  Urine Measurement/Characteristics  Urine (mL) 0 mL  Urinary Interventions Bladder scan  Bladder Scan Volume (mL) 342 mL   Provider made aware.

## 2023-12-04 NOTE — Progress Notes (Addendum)
 Subjective: CC: Events noted overnight. Patient underwent PO SBO protocol. 8 hour delay xray w/ contrast in stomach, small bowel not visualized, large stool burden in the colon. Patient developed tachycardia overnight. No fever or hypotension. NGT was placed w/ 2L out on placement. Xray confirmed NGT placement. Was given 500cc bolus of NS and is on mIVF at 100ml/hr currently.   Family at bedside. Seen with RN. Patient reports last night she developed worsening upper abdominal pain and recurrent n/v.  After NGT placement she reports she feels better but is still nauseated and has epigastric and lower abdominal pain. No flatus or BM. She reports she only voided x 2 yesterday and has not voided today.   She reports she does have a history of gastroparesis that was diagnosed on imaging by GI per report (I cannot see these images) and chronic constipation that she is on a bowel regimen for at baseline.   On last vitals patient is afebrile without hypotension. She remains tachycardic with last HR documented at 0335 earlier this morning. RN repeated vitals while in the room. Remains afebrile without hypotension. Still tachycardic in the 130's. She is not on a beta-blocker at baseline.   WBC 18.8 from 9. Cr wnl. K 3.4. Mg 2.2.   Objective: Vital signs in last 24 hours: Temp:  [97.4 F (36.3 C)-99.3 F (37.4 C)] 97.4 F (36.3 C) (10/15 0651) Pulse Rate:  [99-137] 137 (10/15 0335) Resp:  [16-22] 20 (10/15 0335) BP: (140-152)/(85-100) 140/85 (10/15 0335) SpO2:  [92 %-100 %] 98 % (10/15 0335) Last BM Date : 11/29/23  Intake/Output from previous day: 10/14 0701 - 10/15 0700 In: 2591.7 [I.V.:2541.7; IV Piggyback:50] Out: 2000 [Emesis/NG output:2000] Intake/Output this shift: No intake/output data recorded.  PE: Gen:  Alert, NAD, pleasant Card:  Tachy Pulm:  Rate and effort normal Abd: Soft, mild to moderate distension, lower abdominal tenderness to palpation without rigidity or  guarding. NGT in place with bilious output - flushed and unclogged.  Psych: A&Ox3   Lab Results:  Recent Labs    12/03/23 0801 12/04/23 0550  WBC 9.0 18.8*  HGB 13.7 14.8  HCT 44.2 46.5*  PLT 222 222   BMET Recent Labs    12/02/23 1918 12/03/23 0801 12/04/23 0550  NA 138  --  144  K 3.8  --  3.4*  CL 99  --  106  CO2 25  --  22  GLUCOSE 106*  --  125*  BUN 8  --  17  CREATININE 0.61 0.58 0.64  CALCIUM 10.4*  --  9.4   PT/INR No results for input(s): LABPROT, INR in the last 72 hours. CMP     Component Value Date/Time   NA 144 12/04/2023 0550   NA 139 12/23/2020 1349   K 3.4 (L) 12/04/2023 0550   CL 106 12/04/2023 0550   CO2 22 12/04/2023 0550   GLUCOSE 125 (H) 12/04/2023 0550   BUN 17 12/04/2023 0550   BUN 11 12/23/2020 1349   CREATININE 0.64 12/04/2023 0550   CALCIUM 9.4 12/04/2023 0550   PROT 7.9 12/02/2023 1918   ALBUMIN 4.6 12/02/2023 1918   AST 27 12/02/2023 1918   ALT 12 12/02/2023 1918   ALKPHOS 136 (H) 12/02/2023 1918   BILITOT 0.6 12/02/2023 1918   GFRNONAA >60 12/04/2023 0550   Lipase     Component Value Date/Time   LIPASE 18 12/02/2023 1918    Studies/Results: DG Abd 1 View Result Date: 12/04/2023 EXAM:  1 VIEW XRAY OF THE ABDOMEN 12/04/2023 02:05:00 AM COMPARISON: 12/03/2023 CLINICAL HISTORY: Encounter for imaging study to confirm nasogastric (NG) tube placement. FINDINGS: LINES, TUBES AND DEVICES: Enteric tube in place with distal tip and side port terminating within the expected location of the gastric body. BOWEL: Nonobstructive bowel gas pattern. SOFT TISSUES: Right upper quadrant surgical clips noted. No opaque urinary calculi. BONES: S shaped thoracolumbar scoliosis. LUNGS: Left lung base atelectasis. IMPRESSION: 1. Enteric tube appropriately positioned with distal tip and side port in the gastric body. Electronically signed by: Pinkie Pebbles MD 12/04/2023 02:13 AM EDT RP Workstation: [id number]   DG Abd Portable 1V-Small Bowel  Obstruction Protocol-initial, 8 hr delay Result Date: 12/03/2023 CLINICAL DATA:  Small bowel obstruction EXAM: PORTABLE ABDOMEN - 1 VIEW COMPARISON:  CT 12/02/2023 FINDINGS: Oral contrast material noted within a distended stomach. Large stool burden within the colon. No small bowel dilatation noted on this single image. No free air or organomegaly. IMPRESSION: Contrast material noted within a distended stomach. Small bowel not visualized. Large stool burden in the colon. Electronically Signed   By: Franky Crease M.D.   On: 12/03/2023 23:18   CT ABDOMEN PELVIS WO CONTRAST Result Date: 12/02/2023 EXAM: CT ABDOMEN AND PELVIS WITHOUT CONTRAST 12/02/2023 08:33:20 PM TECHNIQUE: CT of the abdomen and pelvis was performed without the administration of intravenous contrast. Multiplanar reformatted images are provided for review. Automated exposure control, iterative reconstruction, and/or weight-based adjustment of the mA/kV was utilized to reduce the radiation dose to as low as reasonably achievable. COMPARISON: None available. CLINICAL HISTORY: Abdominal pain, acute, nonlocalized; abd pain, emesis. Nonlocalized abdominal pain, pt with a hx of bowel obstruction, pt states she feels like she has another one. Unable to have bowel movement for a few days, emesis today. FINDINGS: LOWER CHEST: No acute abnormality. LIVER: 1.9 x 1.7 cm right posterior hepatic lobe hypodense lesion with a density of 28 Hounsfield units. 1.3 x 1.1 cm fluid density lesion within the right hepatic lobe that likely represents a simple hepatic cyst. GALLBLADDER AND BILE DUCTS: Status post cholecystectomy. No biliary ductal dilatation. SPLEEN: No acute abnormality. PANCREAS: No acute abnormality. ADRENAL GLANDS: No acute abnormality. KIDNEYS, URETERS AND BLADDER: Bilateral nephrolithiasis measuring up to 3 mm on the right and punctate on the left. No ureterolithiasis bilaterally. No associated hydroureteronephrosis. No perinephric or periureteral  stranding. Urinary bladder is unremarkable. GI AND BOWEL: Stomach demonstrates no acute abnormality. Several loops of small bowel are distended with fluid within the pelvis (2:66). No frank dilatation of the small bowel. Trace mesenteric fat stranding noted within the left lower abdomen/pelvis (2:54) with associated possibly developing transition point (7:79). PERITONEUM AND RETROPERITONEUM: No ascites. No free air. VASCULATURE: Aorta is normal in caliber. At least moderate atherosclerotic plaque of the aorta and its branches. LYMPH NODES: No lymphadenopathy. REPRODUCTIVE ORGANS: No acute abnormality. BONES AND SOFT TISSUES: Multilevel mild degenerative changes of the spine with disc bulge at the L3-L4, L4-L5, L5-S1 level. Multilevel intervertebral disc space vacuum phenomenon. No acute osseous abnormality. No focal soft tissue abnormality. IMPRESSION: 1. Question developing/early small bowel obstruction with small bowel fluid distention, mild mesenteric fat stranding in the setting of a likely transition point within the left pelvis. 2. Nonobstructive bilateral nephrolithiasis. 3. Indeterminate 1.9 x 1.7 cm right posterior hepatic lobe hypodense lesion. Limited evaluation of this noncontrast study. Electronically signed by: Morgane Naveau MD 12/02/2023 08:48 PM EDT RP Workstation: [id number]    Anti-infectives: Anti-infectives (From admission, onward)    None  Assessment/Plan SBO - CT 10/13 w/ early small bowel obstruction with small bowel fluid distention, mild mesenteric fat stranding in the setting of a likely transition point within the left pelvis. - Hx of prior umbilical hernia repair as a child, cholecystectomy and abdominal hysterectomy. Hx of SBO 20 years ago in [place] that resolved with conservative management - Patient underwent PO SBO protocol 10/14. 8 hour delay xray w/ contrast in stomach, small bowel not visualized, large stool burden in the colon. Patient developed  tachycardia overnight with worsening abdominal pain, n/v. NGT was placed w/ 2L out on placement.  - Remains tachycardic this morning. No fever or hypotension. WBC 18.8 from 9. He abdomen is soft. No indication for emergency surgery but would like to continue serial abdominal exams and ensure her tachycardia improves with IVF. If tachycardia does not improve or her abdominal exam worsens, she may need OR as early as later today.  - Continue NPO and NGT to LIWS. Consider repeating SBO protocol via NGT after she has had some time to decompress.  - Keep K >=4, Phos >= 3, Mg >= 2 and mobilize for bowel function. Okay to clamp NGT for mobilization.  - Hopefully patient will improve with conservative management. If patient fails to improve with conservative management, they may require exploratory surgery during admission - We will follow with you. - Family updated at beside.    FEN - NPO, NGT to LIWS, IVF fluid bolus followed by mIVF - will reach out to [facility] about IVF VTE - SCDs, SQH ID - None Foley - None, bladder scan as needed if does not void  I reviewed nursing notes, hospitalist notes, last 24 h vitals and pain scores, last 48 h intake and output, last 24 h labs and trends, and last 24 h imaging results.     LOS: 1 day    Ozell CHRISTELLA Shaper, [facility] Surgery 12/04/2023, 8:51 AM Please see Amion for pager number during day hours 7:00am-4:30pm

## 2023-12-04 NOTE — Progress Notes (Addendum)
       Overnight   NAME: Samantha Bright MRN: [id number] DOB : [birth year]    Date of Service   12/04/2023   HPI/Events of Note    Notified by RN for x-ray result.  68 year old female history of CAD, hypertension, hyperlipidemia, GERD. Admitted through ER for abdominal pain.  Patient reports her last bowel movement was on last Friday. She reports a history of small bowel obstruction 20 years ago that was reportedly improved with conservative management.  Surgery was consulted by attending/admitting Physician. Per surgical note workup was concerning for SBO. Patient initially refused NGT.  Imaging: In part:  FINDINGS: Oral contrast material noted within a distended stomach. Large stool burden within the colon. No small bowel dilatation noted on this single image. No free air or organomegaly.   IMPRESSION: Contrast material noted within a distended stomach. Small bowel not visualized. Large stool burden in the colon.     Electronically Signed   By: Franky Crease M.D.   On: 12/03/2023 23:18  In reference to admitting Physician/Surgeon notations stating that patient may require NGT, observation of distention and small amount of sputum/reflux, NGT will be inserted to low intermittent suction.    Interventions/ Plan   NGT to low intermittent suction Follow-up x-ray Continue attending/consult orders as prior,       Update 0138 hrs.  NG tube successful on 1 attempt by nursing staff with immediate return of greater than 2000 cc of brown small-particulate fluid. Heart rate returning to baseline values currently.  Patient is able to rest now.   Follow up KUB -pending =======================================================  Update 0237 Hrs Imaging in Part:   IMPRESSION: 1. Enteric tube appropriately positioned with distal tip and side port in the gastric body.   Electronically signed by: Pinkie Pebbles MD 12/04/2023 02:13 AM EDT RP Workstation:  [id number]  IV Fluid continued in light of reduced intake/elevated heart rate, and likely some degree of dehydration.  =========================================================   Lynwood Kipper BSN MSNA MSN ACNPC-AG Acute Care Nurse Practitioner [facility]

## 2023-12-05 LAB — BASIC METABOLIC PANEL WITH GFR
Anion gap: 11 (ref 5–15)
Anion gap: 10 (ref 5–15)
BUN: 17 mg/dL (ref 8–23)
BUN: 16 mg/dL (ref 8–23)
CO2: 28 mmol/L (ref 22–32)
CO2: 27 mmol/L (ref 22–32)
Calcium: 9 mg/dL (ref 8.9–10.3)
Calcium: 9.3 mg/dL (ref 8.9–10.3)
Chloride: 112 mmol/L — ABNORMAL HIGH (ref 98–111)
Chloride: 109 mmol/L (ref 98–111)
Creatinine, Ser: 0.45 mg/dL (ref 0.44–1.00)
Creatinine, Ser: 0.54 mg/dL (ref 0.44–1.00)
GFR, Estimated: >60 mL/min (ref >60)
GFR, Estimated: >60 mL/min (ref >60)
Glucose, Bld: 86 mg/dL (ref 70–99)
Glucose, Bld: 118 mg/dL — ABNORMAL HIGH (ref 70–99)
Potassium: 3.8 mmol/L (ref 3.5–5.1)
Potassium: 3.7 mmol/L (ref 3.5–5.1)
Sodium: 151 mmol/L — ABNORMAL HIGH (ref 135–145)
Sodium: 146 mmol/L — ABNORMAL HIGH (ref 135–145)

## 2023-12-05 LAB — CBC
HCT: 36.6 % (ref 36.0–46.0)
Hemoglobin: 11.5 g/dL — ABNORMAL LOW (ref 12.0–15.0)
MCH: 31 pg (ref 26.0–34.0)
MCHC: 31.4 g/dL (ref 30.0–36.0)
MCV: 98.7 fL (ref 80.0–100.0)
Platelets: 189 K/uL (ref 150–400)
RBC: 3.71 MIL/uL — ABNORMAL LOW (ref 3.87–5.11)
RDW: 12.6 % (ref 11.5–15.5)
WBC: 14.6 K/uL — ABNORMAL HIGH (ref 4.0–10.5)
nRBC: 0 % (ref 0.0–0.2)

## 2023-12-05 MED ORDER — CHLORHEXIDINE GLUCONATE CLOTH 2 % EX PADS
6.0000 | MEDICATED_PAD | Freq: Every day | CUTANEOUS | Status: DC
  Administered 2023-12-05: 6 via TOPICAL

## 2023-12-05 MED ORDER — LACTATED RINGERS IV SOLN: INTRAVENOUS | Status: DC

## 2023-12-05 MED ORDER — TRAZODONE HCL 50 MG PO TABS
25.0000 mg | ORAL_TABLET | Freq: Every evening | ORAL | Status: DC | PRN
  Administered 2023-12-05 – 2023-12-06 (×2): 25 mg via ORAL
  Filled 2023-12-05 (×2): qty 1

## 2023-12-05 NOTE — Plan of Care (Signed)

## 2023-12-05 NOTE — Progress Notes (Addendum)
 PROGRESS NOTE    Samantha Bright  FMW:[id number] DOB: [birth year] DOA: 12/02/2023 PCP: Chrystal Lamarr RAMAN, MD   Brief Narrative:  Samantha Bright is a 68 y.o. female who presents with worsening abdominal pain and reports no bowel movement in 4+ days. Patient has known medical history of hypertension hyperlipidemia GERD, CAD, recurrent mechanical falls and chronic sciatica with history of multiple abdominal surgeries and previous small bowel obstruction.  Imaging at intake concerning for possible early small bowel obstruction with transition point of the left pelvis, given patient's medical history, inability to tolerate p.o. due to nausea and profound constipation hospitalist was called for admission.  Dr. Ebbie with general surgery was called in consult.    Assessment & Plan:   Principal Problem:   Partial small bowel obstruction (HCC) Active Problems:   SBO (small bowel obstruction) (HCC)    Small bowel obstruction early versus partial, POA - General Surgery following, appreciate insight recommendations - Gastrografin study without obvious contrast in the colon on 12/04/2023 - NG tube placed 12/04/2023 now removed given improvement in output - Patient reports multiple episodes of flatus and bowel movement overnight -Advance diet to clears per general surgery   Urinary obstruction, likely secondary to above  - Foley placed given elevated bladder scan with inability to urinate.  Concern there is some aspect of compression given above - Foley removal trial planned 12/06/2023  Hypernatremia  - Mild - likely secondary to previous IV fluids and hemoconcentration, transition to lactated Ringer's and follow repeat labs  tachycardia - Likely secondary to above discomfort/obstruction -improving with supportive care/fluids  Leukocytosis - Likely reactive given above - remains afebrile - no clear source or presumed infection - Now downtrending appropriately with supportive care,  hold antibiotics  Hypertension Hyperlipidemia  GERD CAD Chronic sciatica - Resume home medications once able to tolerate p.o. more appropriately - Blood pressure moderately well-controlled, hold statin, transition PPI to IV otherwise continue supportive care   DVT prophylaxis: heparin injection 5,000 Units Start: 12/03/23 1400 Code Status:   Code Status: Full Code Family Communication: At bedside, multiple  Status is: inpt  Dispo: The patient is from: Home              Anticipated d/c is to: Home              Anticipated d/c date is: 48 to 72 hours              Patient currently NOT medically stable for discharge  Consultants:  General Surgery  Procedures:  None planned at this time  Antimicrobials:  None   Subjective: Improving NG output overnight, symptoms apparently resolving, multiple episodes of flatus and bowel movements.  Abdominal pain and distention markedly improved as well.  Objective: Vitals:   12/04/23 0651 12/04/23 0910 12/04/23 1135 12/04/23 1327  BP:  126/78 132/81 134/80  Pulse:  (!) 133 (!) 123 (!) 109  Resp:   16 18  Temp: (!) 97.4 F (36.3 C) 99.6 F (37.6 C) 98.5 F (36.9 C) 98.9 F (37.2 C)  TempSrc: Oral Oral  Oral  SpO2:  98% 97% 100%  Weight:      Height:        Intake/Output Summary (Last 24 hours) at 12/05/2023 0749 Last data filed at 12/04/2023 1800 Gross per 24 hour  Intake 422.46 ml  Output 1100 ml  Net -677.54 ml   Filed Weights   12/02/23 1915  Weight: 74.8 kg    Examination:  General:  Pleasantly resting  in bed, No acute distress. HEENT:  Normocephalic atraumatic.  NG tube intact Neck:  Without mass or deformity.  Trachea is midline. Lungs:  Clear to auscultate bilaterally without rhonchi, wheeze, or rales. Heart:  Regular rate and rhythm.  Without murmurs, rubs, or gallops. Abdomen: Soft, nondistended, minimally tender GU: Foley catheter in place Extremities: Without cyanosis, clubbing, edema, or obvious  deformity. Skin:  Warm and dry, no erythema.  Data Reviewed: I have personally reviewed following labs and imaging studies  CBC: Recent Labs  Lab 12/02/23 1918 12/03/23 0801 12/04/23 0550 12/05/23 0529  WBC 9.8 9.0 18.8* 14.6*  HGB 14.8 13.7 14.8 11.5*  HCT 43.9 44.2 46.5* 36.6  MCV 92.2 96.9 98.7 98.7  PLT 231 222 222 189   Basic Metabolic Panel: Recent Labs  Lab 12/02/23 1918 12/03/23 0801 12/04/23 0550 12/05/23 0529  NA 138  --  144 151*  K 3.8  --  3.4* 3.8  CL 99  --  106 112*  CO2 25  --  22 28  GLUCOSE 106*  --  125* 86  BUN 8  --  17 17  CREATININE 0.61 0.58 0.64 0.45  CALCIUM 10.4*  --  9.4 9.0  MG  --   --  2.2  --    GFR: Estimated Creatinine Clearance: 66 mL/min (by C-G formula based on SCr of 0.45 mg/dL). Liver Function Tests: Recent Labs  Lab 12/02/23 1918  AST 27  ALT 12  ALKPHOS 136*  BILITOT 0.6  PROT 7.9  ALBUMIN 4.6   Recent Labs  Lab 12/02/23 1918  LIPASE 18   No results found for this or any previous visit (from the past 240 hours).   Radiology Studies: DG Abd Portable 1V-Small Bowel Obstruction Protocol-initial, 8 hr delay Result Date: 12/04/2023 EXAM: 1 VIEW XRAY OF THE ABDOMEN 12/04/2023 11:21:00 PM COMPARISON: None available. CLINICAL HISTORY: Small bowel obstruction. FINDINGS: BOWEL: Oral contrast is seen throughout nondilated distal small bowel and throughout nondilated colon. There is no evidence for bowel obstruction. SOFT TISSUES: Cholecystectomy clips are present. No opaque urinary calculi. BONES: Intertrochanteric hip is at the level of the distal stomach. No acute osseous abnormality. IMPRESSION: 1. No bowel obstruction 2. Post-cholecystectomy clips present Electronically signed by: Greig Pique MD 12/04/2023 11:34 PM EDT RP Workstation: [id number]   DG Abd 1 View Result Date: 12/04/2023 EXAM: 1 VIEW XRAY OF THE ABDOMEN 12/04/2023 02:05:00 AM COMPARISON: 12/03/2023 CLINICAL HISTORY: Encounter for imaging study to confirm  nasogastric (NG) tube placement. FINDINGS: LINES, TUBES AND DEVICES: Enteric tube in place with distal tip and side port terminating within the expected location of the gastric body. BOWEL: Nonobstructive bowel gas pattern. SOFT TISSUES: Right upper quadrant surgical clips noted. No opaque urinary calculi. BONES: S shaped thoracolumbar scoliosis. LUNGS: Left lung base atelectasis. IMPRESSION: 1. Enteric tube appropriately positioned with distal tip and side port in the gastric body. Electronically signed by: Pinkie Pebbles MD 12/04/2023 02:13 AM EDT RP Workstation: [id number]   DG Abd Portable 1V-Small Bowel Obstruction Protocol-initial, 8 hr delay Result Date: 12/03/2023 CLINICAL DATA:  Small bowel obstruction EXAM: PORTABLE ABDOMEN - 1 VIEW COMPARISON:  CT 12/02/2023 FINDINGS: Oral contrast material noted within a distended stomach. Large stool burden within the colon. No small bowel dilatation noted on this single image. No free air or organomegaly. IMPRESSION: Contrast material noted within a distended stomach. Small bowel not visualized. Large stool burden in the colon. Electronically Signed   By: Franky Crease M.D.   On: 12/03/2023 23:18  Scheduled Meds:  heparin  5,000 Units Subcutaneous Q8H   Continuous Infusions:  0.9 % NaCl with KCl 40 mEq / L 40 mL/hr at 12/04/23 1350     LOS: 2 days   Time spent:  Elsie JAYSON Montclair, DO [facility]  If 7PM-7AM, please contact night-coverage [email or web address]  12/05/2023, 7:49 AM

## 2023-12-05 NOTE — Progress Notes (Signed)
 Subjective: CC: Discussed with RN. Patients daughter on speaker phone while I was in the room per patients request.   Patient reports she is feeling better. She has had her NGT clamped for the last ~6 hours per report. Patient reports no n/v and her abdominal pain has resolved. Bloating improved. She is passing flatus and had 4 BM's yesterday. Xray last night with contrast in the colon.   Afebrile. HR down from when I saw her yesterday. No hypotension. WBC downtrending.   Objective: Vital signs in last 24 hours: Temp:  [98.5 F (36.9 C)-98.9 F (37.2 C)] 98.9 F (37.2 C) (10/15 1327) Pulse Rate:  [109-123] 109 (10/15 1327) Resp:  [16-18] 18 (10/15 1327) BP: (132-134)/(80-81) 134/80 (10/15 1327) SpO2:  [97 %-100 %] 100 % (10/15 1327) Last BM Date : 11/29/23  Intake/Output from previous day: 10/15 0701 - 10/16 0700 In: 422.5 [I.V.:142.5; NG/GT:230; IV Piggyback:50] Out: 1100 [Urine:500; Emesis/NG output:600] Intake/Output this shift: No intake/output data recorded.  PE: Gen:  Alert, NAD, pleasant Pulm:  Rate and effort normal Abd: Soft, improved mild distension, NT. NGT clamped.  Psych: A&Ox3   Lab Results:  Recent Labs    12/04/23 0550 12/05/23 0529  WBC 18.8* 14.6*  HGB 14.8 11.5*  HCT 46.5* 36.6  PLT 222 189   BMET Recent Labs    12/04/23 0550 12/05/23 0529  NA 144 151*  K 3.4* 3.8  CL 106 112*  CO2 22 28  GLUCOSE 125* 86  BUN 17 17  CREATININE 0.64 0.45  CALCIUM 9.4 9.0   PT/INR No results for input(s): LABPROT, INR in the last 72 hours. CMP     Component Value Date/Time   NA 151 (H) 12/05/2023 0529   NA 139 12/23/2020 1349   K 3.8 12/05/2023 0529   CL 112 (H) 12/05/2023 0529   CO2 28 12/05/2023 0529   GLUCOSE 86 12/05/2023 0529   BUN 17 12/05/2023 0529   BUN 11 12/23/2020 1349   CREATININE 0.45 12/05/2023 0529   CALCIUM 9.0 12/05/2023 0529   PROT 7.9 12/02/2023 1918   ALBUMIN 4.6 12/02/2023 1918   AST 27 12/02/2023 1918   ALT  12 12/02/2023 1918   ALKPHOS 136 (H) 12/02/2023 1918   BILITOT 0.6 12/02/2023 1918   GFRNONAA >60 12/05/2023 0529   Lipase     Component Value Date/Time   LIPASE 18 12/02/2023 1918    Studies/Results: DG Abd Portable 1V-Small Bowel Obstruction Protocol-initial, 8 hr delay Result Date: 12/04/2023 EXAM: 1 VIEW XRAY OF THE ABDOMEN 12/04/2023 11:21:00 PM COMPARISON: None available. CLINICAL HISTORY: Small bowel obstruction. FINDINGS: BOWEL: Oral contrast is seen throughout nondilated distal small bowel and throughout nondilated colon. There is no evidence for bowel obstruction. SOFT TISSUES: Cholecystectomy clips are present. No opaque urinary calculi. BONES: Intertrochanteric hip is at the level of the distal stomach. No acute osseous abnormality. IMPRESSION: 1. No bowel obstruction 2. Post-cholecystectomy clips present Electronically signed by: Greig Pique MD 12/04/2023 11:34 PM EDT RP Workstation: [id number]   DG Abd 1 View Result Date: 12/04/2023 EXAM: 1 VIEW XRAY OF THE ABDOMEN 12/04/2023 02:05:00 AM COMPARISON: 12/03/2023 CLINICAL HISTORY: Encounter for imaging study to confirm nasogastric (NG) tube placement. FINDINGS: LINES, TUBES AND DEVICES: Enteric tube in place with distal tip and side port terminating within the expected location of the gastric body. BOWEL: Nonobstructive bowel gas pattern. SOFT TISSUES: Right upper quadrant surgical clips noted. No opaque urinary calculi. BONES: S shaped thoracolumbar scoliosis. LUNGS: Left  lung base atelectasis. IMPRESSION: 1. Enteric tube appropriately positioned with distal tip and side port in the gastric body. Electronically signed by: Pinkie Pebbles MD 12/04/2023 02:13 AM EDT RP Workstation: [id number]   DG Abd Portable 1V-Small Bowel Obstruction Protocol-initial, 8 hr delay Result Date: 12/03/2023 CLINICAL DATA:  Small bowel obstruction EXAM: PORTABLE ABDOMEN - 1 VIEW COMPARISON:  CT 12/02/2023 FINDINGS: Oral contrast material noted within a  distended stomach. Large stool burden within the colon. No small bowel dilatation noted on this single image. No free air or organomegaly. IMPRESSION: Contrast material noted within a distended stomach. Small bowel not visualized. Large stool burden in the colon. Electronically Signed   By: Franky Crease M.D.   On: 12/03/2023 23:18    Anti-infectives: Anti-infectives (From admission, onward)    None        Assessment/Plan SBO - CT 10/13 w/ early small bowel obstruction with small bowel fluid distention, mild mesenteric fat stranding in the setting of a likely transition point within the left pelvis. - Hx of prior umbilical hernia repair as a child, cholecystectomy and abdominal hysterectomy. Hx of SBO 20 years ago in [place] that resolved with conservative management. She also reports history of Gastroparesis and chronic constipation.  - No indication for emergency surgery. Clinically and radiographically resolving. Xray 10/15 with contrast in colon. She has return of bowel function, is tolerating NGT clamped and is NT on exam. Will d/c NGT and advance to CLD.  - We will follow with you.   FEN - CLD. IVF per primary. Miralax VTE - SCDs, SQH ID - None Foley - In place, per primary   I reviewed nursing notes, hospitalist notes, last 24 h vitals and pain scores, last 48 h intake and output, last 24 h labs and trends, and last 24 h imaging results.     LOS: 2 days    Ozell CHRISTELLA Shaper, [facility] Surgery 12/05/2023, 9:14 AM Please see Amion for pager number during day hours 7:00am-4:30pm

## 2023-12-06 LAB — CBC
HCT: 37.8 % (ref 36.0–46.0)
Hemoglobin: 11.8 g/dL — ABNORMAL LOW (ref 12.0–15.0)
MCH: 30.6 pg (ref 26.0–34.0)
MCHC: 31.2 g/dL (ref 30.0–36.0)
MCV: 97.9 fL (ref 80.0–100.0)
Platelets: 187 K/uL (ref 150–400)
RBC: 3.86 MIL/uL — ABNORMAL LOW (ref 3.87–5.11)
RDW: 12.6 % (ref 11.5–15.5)
WBC: 12.5 K/uL — ABNORMAL HIGH (ref 4.0–10.5)
nRBC: 0 % (ref 0.0–0.2)

## 2023-12-06 LAB — BASIC METABOLIC PANEL WITH GFR
Anion gap: 11 (ref 5–15)
BUN: 14 mg/dL (ref 8–23)
CO2: 26 mmol/L (ref 22–32)
Calcium: 9 mg/dL (ref 8.9–10.3)
Chloride: 106 mmol/L (ref 98–111)
Creatinine, Ser: 0.45 mg/dL (ref 0.44–1.00)
GFR, Estimated: >60 mL/min (ref >60)
Glucose, Bld: 65 mg/dL — ABNORMAL LOW (ref 70–99)
Potassium: 3.5 mmol/L (ref 3.5–5.1)
Sodium: 143 mmol/L (ref 135–145)

## 2023-12-06 MED ORDER — ALUM & MAG HYDROXIDE-SIMETH 200-200-20 MG/5ML PO SUSP: 15.0000 mL | Freq: Once | ORAL | Status: DC

## 2023-12-06 MED ADMIN — Famotidine Tab 20 MG: 40 mg | ORAL | NDC 00904719361

## 2023-12-06 MED FILL — Famotidine Tab 20 MG: 40.0000 mg | ORAL | Qty: 2 | Status: AC

## 2023-12-06 NOTE — Plan of Care (Signed)
  Problem: Education: Goal: Knowledge of General Education information will improve Description: Including pain rating scale, medication(s)/side effects and non-pharmacologic comfort measures Outcome: Progressing   Problem: Clinical Measurements: Goal: Ability to maintain clinical measurements within normal limits will improve Outcome: Progressing Goal: Diagnostic test results will improve Outcome: Progressing   Problem: Activity: Goal: Risk for activity intolerance will decrease Outcome: Progressing   Problem: Nutrition: Goal: Adequate nutrition will be maintained Outcome: Progressing   Problem: Elimination: Goal: Will not experience complications related to bowel motility Outcome: Progressing Goal: Will not experience complications related to urinary retention Outcome: Progressing   Problem: Pain Managment: Goal: General experience of comfort will improve and/or be controlled Outcome: Progressing

## 2023-12-06 NOTE — Progress Notes (Signed)
       Subjective: CC: Tolerating CLD. Had more BM. Abdominal pain essentially resolved.   Objective: Vital signs in last 24 hours: Temp:  [98.1 F (36.7 C)-99.3 F (37.4 C)] 99.3 F (37.4 C) (10/17 0622) Pulse Rate:  [81-96] 91 (10/17 0622) Resp:  [18-20] 20 (10/17 0622) BP: (142-155)/(78-89) 155/78 (10/17 0622) SpO2:  [92 %-97 %] 92 % (10/17 0622) Last BM Date : 12/05/23  Intake/Output from previous day: 10/16 0701 - 10/17 0700 In: 904.4 [I.V.:904.4] Out: 1200 [Urine:1200] Intake/Output this shift: No intake/output data recorded.  PE: Gen:  Alert, NAD, pleasant Pulm:  Rate and effort normal Abd: Soft, improved mild distension, non-tender Psych: A&Ox3   Lab Results:  Recent Labs    12/05/23 0529 12/06/23 0520  WBC 14.6* 12.5*  HGB 11.5* 11.8*  HCT 36.6 37.8  PLT 189 187   BMET Recent Labs    12/05/23 1514 12/06/23 0520  NA 146* 143  K 3.7 3.5  CL 109 106  CO2 27 26  GLUCOSE 118* 65*  BUN 16 14  CREATININE 0.54 0.45  CALCIUM 9.3 9.0   PT/INR No results for input(s): LABPROT, INR in the last 72 hours. CMP     Component Value Date/Time   NA 143 12/06/2023 0520   NA 139 12/23/2020 1349   K 3.5 12/06/2023 0520   CL 106 12/06/2023 0520   CO2 26 12/06/2023 0520   GLUCOSE 65 (L) 12/06/2023 0520   BUN 14 12/06/2023 0520   BUN 11 12/23/2020 1349   CREATININE 0.45 12/06/2023 0520   CALCIUM 9.0 12/06/2023 0520   PROT 7.9 12/02/2023 1918   ALBUMIN 4.6 12/02/2023 1918   AST 27 12/02/2023 1918   ALT 12 12/02/2023 1918   ALKPHOS 136 (H) 12/02/2023 1918   BILITOT 0.6 12/02/2023 1918   GFRNONAA >60 12/06/2023 0520   Lipase     Component Value Date/Time   LIPASE 18 12/02/2023 1918    Studies/Results: DG Abd Portable 1V-Small Bowel Obstruction Protocol-initial, 8 hr delay Result Date: 12/04/2023 EXAM: 1 VIEW XRAY OF THE ABDOMEN 12/04/2023 11:21:00 PM COMPARISON: None available. CLINICAL HISTORY: Small bowel obstruction. FINDINGS: BOWEL: Oral  contrast is seen throughout nondilated distal small bowel and throughout nondilated colon. There is no evidence for bowel obstruction. SOFT TISSUES: Cholecystectomy clips are present. No opaque urinary calculi. BONES: Intertrochanteric hip is at the level of the distal stomach. No acute osseous abnormality. IMPRESSION: 1. No bowel obstruction 2. Post-cholecystectomy clips present Electronically signed by: Greig Pique MD 12/04/2023 11:34 PM EDT RP Workstation: [id number]    Anti-infectives: Anti-infectives (From admission, onward)    None        Assessment/Plan SBO - Multiple BM without evidence of ongoing obstruction - Adv to soft diet - Would be reasonable to discharge later today from surgery perspective   FEN - Soft VTE - SCDs, SQH ID - None Foley - In place, per primary   I reviewed nursing notes, hospitalist notes, last 24 h vitals and pain scores, last 48 h intake and output, last 24 h labs and trends, and last 24 h imaging results.     LOS: 3 days    Cordella DELENA Idler, MD [facility] Surgery 12/06/2023, 9:51 AM Please see Amion for pager number during day hours 7:00am-4:30pm

## 2023-12-06 NOTE — Progress Notes (Signed)
 Patient tolerated breakfast but is now having some discomfort that seems like reflux. She takes famotidine 40mg  BID. MD notified.

## 2023-12-06 NOTE — Progress Notes (Signed)
 PROGRESS NOTE    Samantha Bright  FMW:[id number] DOB: [birth year] DOA: 12/02/2023 PCP: Chrystal Lamarr RAMAN, MD   Brief Narrative:  Samantha Bright is a 68 y.o. female who presents with worsening abdominal pain and reports no bowel movement in 4+ days. Patient has known medical history of hypertension hyperlipidemia GERD, CAD, recurrent mechanical falls and chronic sciatica with history of multiple abdominal surgeries and previous small bowel obstruction.  Imaging at intake concerning for possible early small bowel obstruction with transition point of the left pelvis, given patient's medical history, inability to tolerate p.o. due to nausea and profound constipation hospitalist was called for admission.  Dr. Ebbie with general surgery was called in consult.    Assessment & Plan:   Principal Problem:   Partial small bowel obstruction (HCC) Active Problems:   SBO (small bowel obstruction) (HCC)    Small bowel obstruction early versus partial, POA - General Surgery following, appreciate insight recommendations - Gastrografin study without obvious contrast in the colon on 12/04/2023 - NG tube placed 12/04/2023 now removed given improvement in output - Patient continues to have episodes of flatus and bowel movements without difficulty -Advance diet to soft diet per surgery -Patient had episode of worsening abdominal pain this afternoon, as such we will monitor overnight to ensure no worsening symptoms and continue current diet in the interim with plans for discharge home in the next 1 to 2 days   Urinary obstruction, likely secondary to above  - Foley placed given elevated bladder scan with inability to urinate.  Concern there is some aspect of compression given above - Foley removal trial planned 12/06/2023  Hypernatremia  - Mild - likely secondary to previous IV fluids and hemoconcentration, transition to lactated Ringer's and follow repeat labs  tachycardia - Likely secondary to  above discomfort/obstruction -improving with supportive care/fluids  Leukocytosis - Likely reactive given above - remains afebrile - no clear source or presumed infection - Now downtrending appropriately with supportive care, hold antibiotics  Hypertension Hyperlipidemia  GERD CAD Chronic sciatica -Transition back to p.o. medications, continue famotidine   DVT prophylaxis: heparin injection 5,000 Units Start: 12/03/23 1400 Code Status:   Code Status: Full Code Family Communication: At bedside, multiple  Status is: inpt  Dispo: The patient is from: Home              Anticipated d/c is to: Home              Anticipated d/c date is: 48 to 72 hours              Patient currently NOT medically stable for discharge  Consultants:  General Surgery  Procedures:  None planned at this time  Antimicrobials:  None   Subjective: No acute issues or events overnight tolerating p.o. quite well today until the afternoon patient worsening abdominal pain as above denies nausea vomiting chest pain shortness of breath headache fevers or chills.  Objective: Vitals:   12/04/23 1327 12/05/23 1136 12/05/23 2351 12/06/23 0622  BP: 134/80 (!) 153/89 (!) 142/78 (!) 155/78  Pulse: (!) 109 81 96 91  Resp: 18 18 18 20   Temp: 98.9 F (37.2 C) 98.1 F (36.7 C) 98.9 F (37.2 C) 99.3 F (37.4 C)  TempSrc: Oral  Oral   SpO2: 100% 97% 93% 92%  Weight:      Height:        Intake/Output Summary (Last 24 hours) at 12/06/2023 0825 Last data filed at 12/06/2023 0400 Gross per 24 hour  Intake  904.44 ml  Output 1200 ml  Net -295.56 ml   Filed Weights   12/02/23 1915  Weight: 74.8 kg    Examination:  General:  Pleasantly resting in bed, No acute distress. HEENT:  Normocephalic atraumatic.  Neck:  Without mass or deformity.  Trachea is midline. Lungs:  Clear to auscultate bilaterally without rhonchi, wheeze, or rales. Heart:  Regular rate and rhythm.  Without murmurs, rubs, or  gallops. Abdomen: Soft, nondistended, minimally tender GU: Foley catheter in place Extremities: Without cyanosis, clubbing, edema, or obvious deformity. Skin:  Warm and dry, no erythema.  Data Reviewed: I have personally reviewed following labs and imaging studies  CBC: Recent Labs  Lab 12/02/23 1918 12/03/23 0801 12/04/23 0550 12/05/23 0529 12/06/23 0520  WBC 9.8 9.0 18.8* 14.6* 12.5*  HGB 14.8 13.7 14.8 11.5* 11.8*  HCT 43.9 44.2 46.5* 36.6 37.8  MCV 92.2 96.9 98.7 98.7 97.9  PLT 231 222 222 189 187   Basic Metabolic Panel: Recent Labs  Lab 12/02/23 1918 12/03/23 0801 12/04/23 0550 12/05/23 0529 12/05/23 1514 12/06/23 0520  NA 138  --  144 151* 146* 143  K 3.8  --  3.4* 3.8 3.7 3.5  CL 99  --  106 112* 109 106  CO2 25  --  22 28 27 26   GLUCOSE 106*  --  125* 86 118* 65*  BUN 8  --  17 17 16 14   CREATININE 0.61 0.58 0.64 0.45 0.54 0.45  CALCIUM 10.4*  --  9.4 9.0 9.3 9.0  MG  --   --  2.2  --   --   --    GFR: Estimated Creatinine Clearance: 66 mL/min (by C-G formula based on SCr of 0.45 mg/dL). Liver Function Tests: Recent Labs  Lab 12/02/23 1918  AST 27  ALT 12  ALKPHOS 136*  BILITOT 0.6  PROT 7.9  ALBUMIN 4.6   Recent Labs  Lab 12/02/23 1918  LIPASE 18   No results found for this or any previous visit (from the past 240 hours).   Radiology Studies: DG Abd Portable 1V-Small Bowel Obstruction Protocol-initial, 8 hr delay Result Date: 12/04/2023 EXAM: 1 VIEW XRAY OF THE ABDOMEN 12/04/2023 11:21:00 PM COMPARISON: None available. CLINICAL HISTORY: Small bowel obstruction. FINDINGS: BOWEL: Oral contrast is seen throughout nondilated distal small bowel and throughout nondilated colon. There is no evidence for bowel obstruction. SOFT TISSUES: Cholecystectomy clips are present. No opaque urinary calculi. BONES: Intertrochanteric hip is at the level of the distal stomach. No acute osseous abnormality. IMPRESSION: 1. No bowel obstruction 2.  Post-cholecystectomy clips present Electronically signed by: Greig Pique MD 12/04/2023 11:34 PM EDT RP Workstation: [id number]   Scheduled Meds:  Chlorhexidine Gluconate Cloth  6 each Topical Daily   heparin  5,000 Units Subcutaneous Q8H   Continuous Infusions:  lactated ringers 50 mL/hr at 12/05/23 2009     LOS: 3 days   Time spent:  Elsie JAYSON Montclair, DO [facility]  If 7PM-7AM, please contact night-coverage [email or web address]  12/06/2023, 8:25 AM

## 2023-12-07 LAB — BASIC METABOLIC PANEL WITH GFR
Anion gap: 13 (ref 5–15)
BUN: 6 mg/dL — ABNORMAL LOW (ref 8–23)
CO2: 25 mmol/L (ref 22–32)
Calcium: 8.8 mg/dL — ABNORMAL LOW (ref 8.9–10.3)
Chloride: 102 mmol/L (ref 98–111)
Creatinine, Ser: 0.36 mg/dL — ABNORMAL LOW (ref 0.44–1.00)
GFR, Estimated: >60 mL/min (ref >60)
Glucose, Bld: 63 mg/dL — ABNORMAL LOW (ref 70–99)
Potassium: 3.3 mmol/L — ABNORMAL LOW (ref 3.5–5.1)
Sodium: 140 mmol/L (ref 135–145)

## 2023-12-07 LAB — CBC
HCT: 37.1 % (ref 36.0–46.0)
Hemoglobin: 12.3 g/dL (ref 12.0–15.0)
MCH: 31.5 pg (ref 26.0–34.0)
MCHC: 33.2 g/dL (ref 30.0–36.0)
MCV: 94.9 fL (ref 80.0–100.0)
Platelets: 188 K/uL (ref 150–400)
RBC: 3.91 MIL/uL (ref 3.87–5.11)
RDW: 12.2 % (ref 11.5–15.5)
WBC: 10.5 K/uL (ref 4.0–10.5)
nRBC: 0 % (ref 0.0–0.2)

## 2023-12-07 MED ADMIN — Famotidine Tab 20 MG: 40 mg | ORAL | NDC 00904719361

## 2023-12-07 NOTE — Plan of Care (Signed)
  Problem: Education: Goal: Knowledge of General Education information will improve Description: Including pain rating scale, medication(s)/side effects and non-pharmacologic comfort measures Outcome: Progressing   Problem: Clinical Measurements: Goal: Ability to maintain clinical measurements within normal limits will improve Outcome: Progressing Goal: Diagnostic test results will improve Outcome: Progressing   Problem: Activity: Goal: Risk for activity intolerance will decrease Outcome: Progressing   Problem: Nutrition: Goal: Adequate nutrition will be maintained Outcome: Progressing   Problem: Elimination: Goal: Will not experience complications related to bowel motility Outcome: Progressing   Problem: Pain Managment: Goal: General experience of comfort will improve and/or be controlled Outcome: Progressing

## 2023-12-07 NOTE — Discharge Summary (Signed)
 Physician Discharge Summary  Samantha Bright FMW:[id number] DOB: 06-19-1955 DOA: 12/02/2023  PCP: Chrystal Lamarr RAMAN, MD  Admit date: 12/02/2023 Discharge date: 12/07/2023  Admitted From: Home Disposition: Home  Recommendations for Outpatient Follow-up:  Follow up with PCP in 1-2 weeks Follow-up with general surgery and GI as scheduled  Home Health: None Equipment/Devices: None  Discharge Condition: Stable CODE STATUS: Full Diet recommendation: Soft diet as discussed  Brief/Interim Summary: Samantha Bright is a 68 y.o. female who presents with worsening abdominal pain and reports no bowel movement in 4+ days. Patient has known medical history of hypertension hyperlipidemia GERD, CAD, recurrent mechanical falls and chronic sciatica with history of multiple abdominal surgeries and previous small bowel obstruction.  Imaging at intake concerning for possible early small bowel obstruction with transition point of the left pelvis, given patient's medical history, inability to tolerate p.o. due to nausea and profound constipation hospitalist was called for admission.  Dr. Ebbie with general surgery was called in consult.  Patient admitted as above with small bowel obstruction likely early versus partial.  At intake patient symptoms well-controlled but over the following 24 hours patient had markedly worsening pain and distention.  Patient ultimately was agreeable for NG tube placement with moderate improvement.  Over the past 24 hours patient has continued to improve, now having had multiple bowel movements and flatus.  Patient had transient episode of epigastric pain yesterday on the 17th, given new findings patient was held overnight to ensure safe disposition.  Today she continues to tolerate p.o. well without any difficulty or complications.  Previous pain resolved.  Recommend close follow-up with PCP in 1 to 2 weeks.   Discharge Diagnoses:  Principal Problem:   Partial small bowel  obstruction (HCC) Active Problems:   SBO (small bowel obstruction) (HCC)  Small bowel obstruction early versus partial, POA - General Surgery following, appreciate insight recommendations - Gastrografin study without obvious contrast in the colon on 12/04/2023 - NG tube placed 12/04/2023 now removed given improvement in output - Patient continues to have episodes of flatus and bowel movements without difficulty - Advance diet to soft diet per surgery   Urinary obstruction, likely secondary to above, resolved - Foley placed given elevated bladder scan with inability to urinate.  Concern there is some aspect of compression given above - Foley removed 12/06/2023 without any complications or issues   Hypernatremia, resolved - Mild - likely secondary to previous IV fluids and hemoconcentration, transition to lactated Ringer's and follow repeat labs   tachycardia - Likely secondary to above discomfort/obstruction -improving with supportive care/fluids   Leukocytosis - Likely reactive given above - remains afebrile - no clear source or presumed infection - Now downtrending appropriately with supportive care, hold antibiotics   Hypertension Hyperlipidemia  GERD CAD Chronic sciatica -Transition back to p.o. medications, continue famotidine   Discharge Instructions  Discharge Instructions     Call MD for:  difficulty breathing, headache or visual disturbances   Complete by: As directed    Call MD for:  extreme fatigue   Complete by: As directed    Call MD for:  hives   Complete by: As directed    Call MD for:  persistant dizziness or light-headedness   Complete by: As directed    Call MD for:  persistant nausea and vomiting   Complete by: As directed    Call MD for:  severe uncontrolled pain   Complete by: As directed    Call MD for:  temperature >100.4   Complete  by: As directed    Diet general   Complete by: As directed    Soft diet as discussed   Increase activity  slowly   Complete by: As directed    No wound care   Complete by: As directed       Allergies as of 12/07/2023       Reactions   Contrast Media [iodinated Contrast Media] Itching   Omeprazole Hives   Penicillins Hives           Medication List     STOP taking these medications    hydrochlorothiazide 25 MG tablet Commonly known as: HYDRODIURIL   LORazepam  1 MG tablet Commonly known as: Ativan    naproxen  500 MG tablet Commonly known as: Naprosyn        TAKE these medications    albuterol 108 (90 Base) MCG/ACT inhaler Commonly known as: VENTOLIN HFA SMARTSIG:1 Puff(s) Via Inhaler Every 4 Hours PRN   Ascorbic Acid 500 MG Chew Chew 500 mg by mouth daily.   BIOTIN PO Take 1 capsule by mouth daily.   cetirizine 10 MG chewable tablet Commonly known as: ZYRTEC Chew 10 mg by mouth daily.   Cholecalciferol 50 MCG (2000 UT) Caps Take 2,000 Units by mouth daily.   clobetasol 0.05 % external solution Commonly known as: TEMOVATE Apply 1 Application topically daily as needed (scalp issues).   docusate sodium 100 MG capsule Commonly known as: COLACE Take 100 mg by mouth daily.   famotidine 40 MG tablet Commonly known as: PEPCID Take 40 mg by mouth 2 (two) times daily.   fluticasone 50 MCG/ACT nasal spray Commonly known as: FLONASE Place 2 sprays into both nostrils daily as needed for allergies.   fluticasone furoate-vilanterol 100-25 MCG/ACT Aepb Commonly known as: BREO ELLIPTA Inhale 1 puff into the lungs daily as needed (wheezing/SOB).   lidocaine  5 % Commonly known as: Lidoderm  Place 1 patch onto the skin daily. Remove & Discard patch within 12 hours or as directed by MD What changed:  when to take this reasons to take this additional instructions   metroNIDAZOLE 0.75 % cream Commonly known as: METROCREAM Apply 1 Application topically daily.   MULTIVITAMIN ADULT PO Take 2 tablets by mouth daily.   nystatin-triamcinolone cream Commonly known  as: MYCOLOG II Apply 1 Application topically daily as needed (rash).   OVER THE COUNTER MEDICATION Take 1 tablet by mouth daily. Renew Life Supplement   rosuvastatin 10 MG tablet Commonly known as: CRESTOR Take 10 mg by mouth daily.   triamcinolone cream 0.1 % Commonly known as: KENALOG Apply 1 Application topically daily as needed (rash).        Allergies  Allergen Reactions   Contrast Media [Iodinated Contrast Media] Itching   Omeprazole Hives   Penicillins Hives         Consultations: General Surgery  Procedures/Studies: DG Abd Portable 1V-Small Bowel Obstruction Protocol-initial, 8 hr delay Result Date: 12/04/2023 EXAM: 1 VIEW XRAY OF THE ABDOMEN 12/04/2023 11:21:00 PM COMPARISON: None available. CLINICAL HISTORY: Small bowel obstruction. FINDINGS: BOWEL: Oral contrast is seen throughout nondilated distal small bowel and throughout nondilated colon. There is no evidence for bowel obstruction. SOFT TISSUES: Cholecystectomy clips are present. No opaque urinary calculi. BONES: Intertrochanteric hip is at the level of the distal stomach. No acute osseous abnormality. IMPRESSION: 1. No bowel obstruction 2. Post-cholecystectomy clips present Electronically signed by: Greig Pique MD 12/04/2023 11:34 PM EDT RP Workstation: [id number]   DG Abd 1 View Result Date: 12/04/2023 EXAM: 1 VIEW  XRAY OF THE ABDOMEN 12/04/2023 02:05:00 AM COMPARISON: 12/03/2023 CLINICAL HISTORY: Encounter for imaging study to confirm nasogastric (NG) tube placement. FINDINGS: LINES, TUBES AND DEVICES: Enteric tube in place with distal tip and side port terminating within the expected location of the gastric body. BOWEL: Nonobstructive bowel gas pattern. SOFT TISSUES: Right upper quadrant surgical clips noted. No opaque urinary calculi. BONES: S shaped thoracolumbar scoliosis. LUNGS: Left lung base atelectasis. IMPRESSION: 1. Enteric tube appropriately positioned with distal tip and side port in the gastric  body. Electronically signed by: Pinkie Pebbles MD 12/04/2023 02:13 AM EDT RP Workstation: [id number]   DG Abd Portable 1V-Small Bowel Obstruction Protocol-initial, 8 hr delay Result Date: 12/03/2023 CLINICAL DATA:  Small bowel obstruction EXAM: PORTABLE ABDOMEN - 1 VIEW COMPARISON:  CT 12/02/2023 FINDINGS: Oral contrast material noted within a distended stomach. Large stool burden within the colon. No small bowel dilatation noted on this single image. No free air or organomegaly. IMPRESSION: Contrast material noted within a distended stomach. Small bowel not visualized. Large stool burden in the colon. Electronically Signed   By: Franky Crease M.D.   On: 12/03/2023 23:18   CT ABDOMEN PELVIS WO CONTRAST Result Date: 12/02/2023 EXAM: CT ABDOMEN AND PELVIS WITHOUT CONTRAST 12/02/2023 08:33:20 PM TECHNIQUE: CT of the abdomen and pelvis was performed without the administration of intravenous contrast. Multiplanar reformatted images are provided for review. Automated exposure control, iterative reconstruction, and/or weight-based adjustment of the mA/kV was utilized to reduce the radiation dose to as low as reasonably achievable. COMPARISON: None available. CLINICAL HISTORY: Abdominal pain, acute, nonlocalized; abd pain, emesis. Nonlocalized abdominal pain, pt with a hx of bowel obstruction, pt states she feels like she has another one. Unable to have bowel movement for a few days, emesis today. FINDINGS: LOWER CHEST: No acute abnormality. LIVER: 1.9 x 1.7 cm right posterior hepatic lobe hypodense lesion with a density of 28 Hounsfield units. 1.3 x 1.1 cm fluid density lesion within the right hepatic lobe that likely represents a simple hepatic cyst. GALLBLADDER AND BILE DUCTS: Status post cholecystectomy. No biliary ductal dilatation. SPLEEN: No acute abnormality. PANCREAS: No acute abnormality. ADRENAL GLANDS: No acute abnormality. KIDNEYS, URETERS AND BLADDER: Bilateral nephrolithiasis measuring up to 3 mm on  the right and punctate on the left. No ureterolithiasis bilaterally. No associated hydroureteronephrosis. No perinephric or periureteral stranding. Urinary bladder is unremarkable. GI AND BOWEL: Stomach demonstrates no acute abnormality. Several loops of small bowel are distended with fluid within the pelvis (2:66). No frank dilatation of the small bowel. Trace mesenteric fat stranding noted within the left lower abdomen/pelvis (2:54) with associated possibly developing transition point (7:79). PERITONEUM AND RETROPERITONEUM: No ascites. No free air. VASCULATURE: Aorta is normal in caliber. At least moderate atherosclerotic plaque of the aorta and its branches. LYMPH NODES: No lymphadenopathy. REPRODUCTIVE ORGANS: No acute abnormality. BONES AND SOFT TISSUES: Multilevel mild degenerative changes of the spine with disc bulge at the L3-L4, L4-L5, L5-S1 level. Multilevel intervertebral disc space vacuum phenomenon. No acute osseous abnormality. No focal soft tissue abnormality. IMPRESSION: 1. Question developing/early small bowel obstruction with small bowel fluid distention, mild mesenteric fat stranding in the setting of a likely transition point within the left pelvis. 2. Nonobstructive bilateral nephrolithiasis. 3. Indeterminate 1.9 x 1.7 cm right posterior hepatic lobe hypodense lesion. Limited evaluation of this noncontrast study. Electronically signed by: Morgane Naveau MD 12/02/2023 08:48 PM EDT RP Workstation: [id number]     Subjective: No acute issues or events overnight denies nausea vomit diarrhea constipation any fevers  chills or chest pain   Discharge Exam: Vitals:   12/06/23 2010 12/07/23 0546  BP: (!) 154/86 (!) 146/86  Pulse: 93 100  Resp: 20 20  Temp: 99.4 F (37.4 C) 98.6 F (37 C)  SpO2: 95% 92%   Vitals:   12/06/23 0622 12/06/23 1152 12/06/23 2010 12/07/23 0546  BP: (!) 155/78 (!) 143/79 (!) 154/86 (!) 146/86  Pulse: 91 89 93 100  Resp: 20 17 20 20   Temp: 99.3 F (37.4 C)  98.3 F (36.8 C) 99.4 F (37.4 C) 98.6 F (37 C)  TempSrc:      SpO2: 92% 97% 95% 92%  Weight:      Height:        General: Pt is alert, awake, not in acute distress Cardiovascular: RRR, S1/S2 +, no rubs, no gallops Respiratory: CTA bilaterally, no wheezing, no rhonchi Abdominal: Soft, NT, ND, bowel sounds + Extremities: no edema, no cyanosis    The results of significant diagnostics from this hospitalization (including imaging, microbiology, ancillary and laboratory) are listed below for reference.     Microbiology: No results found for this or any previous visit (from the past 240 hours).   Labs: BNP (last 3 results) No results for input(s): BNP in the last 8760 hours. Basic Metabolic Panel: Recent Labs  Lab 12/04/23 0550 12/05/23 0529 12/05/23 1514 12/06/23 0520 12/07/23 0622  NA 144 151* 146* 143 140  K 3.4* 3.8 3.7 3.5 3.3*  CL 106 112* 109 106 102  CO2 22 28 27 26 25   GLUCOSE 125* 86 118* 65* 63*  BUN 17 17 16 14  6*  CREATININE 0.64 0.45 0.54 0.45 0.36*  CALCIUM 9.4 9.0 9.3 9.0 8.8*  MG 2.2  --   --   --   --    Liver Function Tests: Recent Labs  Lab 12/02/23 1918  AST 27  ALT 12  ALKPHOS 136*  BILITOT 0.6  PROT 7.9  ALBUMIN 4.6   Recent Labs  Lab 12/02/23 1918  LIPASE 18   No results for input(s): AMMONIA in the last 168 hours. CBC: Recent Labs  Lab 12/03/23 0801 12/04/23 0550 12/05/23 0529 12/06/23 0520 12/07/23 0622  WBC 9.0 18.8* 14.6* 12.5* 10.5  HGB 13.7 14.8 11.5* 11.8* 12.3  HCT 44.2 46.5* 36.6 37.8 37.1  MCV 96.9 98.7 98.7 97.9 94.9  PLT 222 222 189 187 188   Cardiac Enzymes: No results for input(s): CKTOTAL, CKMB, CKMBINDEX, TROPONINI in the last 168 hours. BNP: Invalid input(s): POCBNP CBG: No results for input(s): GLUCAP in the last 168 hours. D-Dimer No results for input(s): DDIMER in the last 72 hours. Hgb A1c No results for input(s): HGBA1C in the last 72 hours. Lipid Profile No results for  input(s): CHOL, HDL, LDLCALC, TRIG, CHOLHDL, LDLDIRECT in the last 72 hours. Thyroid  function studies No results for input(s): TSH, T4TOTAL, T3FREE, THYROIDAB in the last 72 hours.  Invalid input(s): FREET3 Anemia work up No results for input(s): VITAMINB12, FOLATE, FERRITIN, TIBC, IRON, RETICCTPCT in the last 72 hours. Urinalysis    Component Value Date/Time   COLORURINE YELLOW 12/02/2023 1918   APPEARANCEUR CLEAR 12/02/2023 1918   LABSPEC <=1.005 12/02/2023 1918   PHURINE 6.5 12/02/2023 1918   GLUCOSEU NEGATIVE 12/02/2023 1918   HGBUR NEGATIVE 12/02/2023 1918   BILIRUBINUR NEGATIVE 12/02/2023 1918   KETONESUR 15 (A) 12/02/2023 1918   PROTEINUR NEGATIVE 12/02/2023 1918   NITRITE NEGATIVE 12/02/2023 1918   LEUKOCYTESUR SMALL (A) 12/02/2023 1918   Sepsis Labs Recent Labs  Lab 12/04/23 0550 12/05/23 0529 12/06/23 0520 12/07/23 0622  WBC 18.8* 14.6* 12.5* 10.5   Microbiology No results found for this or any previous visit (from the past 240 hours).   Time coordinating discharge: Over 30 minutes  SIGNED:   Elsie JAYSON [place], DO [facility] 12/07/2023, 2:01 PM Pager   If 7PM-7AM, please contact night-coverage [email or web address]

## 2023-12-07 NOTE — Progress Notes (Signed)
   Subjective/Chief Complaint: Having loose stools, tol diet, says ready to go home   Objective: Vital signs in last 24 hours: Temp:  [98.3 F (36.8 C)-99.4 F (37.4 C)] 98.6 F (37 C) (10/18 0546) Pulse Rate:  [89-100] 100 (10/18 0546) Resp:  [17-20] 20 (10/18 0546) BP: (143-154)/(79-86) 146/86 (10/18 0546) SpO2:  [92 %-97 %] 92 % (10/18 0546) Last BM Date : 12/07/23  Intake/Output from previous day: 10/17 0701 - 10/18 0700 In: 1449.1 [P.O.:238; I.V.:1211.1] Out: -  Intake/Output this shift: No intake/output data recorded.  Ab soft nontender  Lab Results:  Recent Labs    12/06/23 0520 12/07/23 0622  WBC 12.5* 10.5  HGB 11.8* 12.3  HCT 37.8 37.1  PLT 187 188   BMET Recent Labs    12/06/23 0520 12/07/23 0622  NA 143 140  K 3.5 3.3*  CL 106 102  CO2 26 25  GLUCOSE 65* 63*  BUN 14 6*  CREATININE 0.45 0.36*  CALCIUM 9.0 8.8*   PT/INR No results for input(s): LABPROT, INR in the last 72 hours. ABG No results for input(s): PHART, HCO3 in the last 72 hours.  Invalid input(s): PCO2, PO2  Studies/Results: No results found.  Anti-infectives: Anti-infectives (From admission, onward)    None       Assessment/Plan: SBO - Multiple BM without evidence of ongoing obstruction - Adv to soft diet - can dc, no surgical indication   FEN - Soft VTE - SCDs, SQH ID - None  I reviewed last 24 h vitals and pain scores, last 48 h intake and output, and last 24 h labs and trends.    Donnice Bury 12/07/2023

## 2023-12-08 DIAGNOSIS — K56600 Partial intestinal obstruction, unspecified as to cause: Secondary | ICD-10-CM | POA: Diagnosis not present

## 2023-12-13 DIAGNOSIS — R600 Localized edema: Secondary | ICD-10-CM | POA: Diagnosis not present

## 2023-12-13 DIAGNOSIS — I10 Essential (primary) hypertension: Secondary | ICD-10-CM | POA: Diagnosis not present

## 2023-12-16 ENCOUNTER — Other Ambulatory Visit (HOSPITAL_COMMUNITY): Payer: Self-pay | Admitting: Student

## 2023-12-16 DIAGNOSIS — R600 Localized edema: Secondary | ICD-10-CM

## 2023-12-17 DIAGNOSIS — K769 Liver disease, unspecified: Secondary | ICD-10-CM | POA: Diagnosis not present

## 2023-12-18 ENCOUNTER — Other Ambulatory Visit: Payer: Self-pay

## 2023-12-20 ENCOUNTER — Other Ambulatory Visit: Payer: Self-pay | Admitting: Family Medicine

## 2023-12-20 DIAGNOSIS — K769 Liver disease, unspecified: Secondary | ICD-10-CM

## 2023-12-31 ENCOUNTER — Ambulatory Visit (HOSPITAL_COMMUNITY): Admission: RE | Admit: 2023-12-31 | Discharge: 2023-12-31 | Attending: Student

## 2023-12-31 DIAGNOSIS — I10 Essential (primary) hypertension: Secondary | ICD-10-CM | POA: Diagnosis not present

## 2023-12-31 DIAGNOSIS — I2510 Atherosclerotic heart disease of native coronary artery without angina pectoris: Secondary | ICD-10-CM | POA: Insufficient documentation

## 2023-12-31 DIAGNOSIS — R600 Localized edema: Secondary | ICD-10-CM | POA: Diagnosis present

## 2023-12-31 LAB — ECHOCARDIOGRAM COMPLETE
AR max vel: 2.16 cm2
AV Area VTI: 2.24 cm2
AV Area mean vel: 1.99 cm2
AV Mean grad: 4 mmHg
AV Peak grad: 6.5 mmHg
Ao pk vel: 1.27 m/s
Area-P 1/2: 3.12 cm2
S' Lateral: 2.4 cm

## 2024-01-01 ENCOUNTER — Ambulatory Visit (HOSPITAL_COMMUNITY)

## 2024-01-03 ENCOUNTER — Other Ambulatory Visit (HOSPITAL_COMMUNITY)

## 2024-01-06 ENCOUNTER — Ambulatory Visit
Admission: RE | Admit: 2024-01-06 | Discharge: 2024-01-06 | Disposition: A | Source: Ambulatory Visit | Attending: Family Medicine

## 2024-01-06 DIAGNOSIS — K769 Liver disease, unspecified: Secondary | ICD-10-CM

## 2024-01-06 MED ORDER — GADOPICLENOL 0.5 MMOL/ML IV SOLN
7.5000 mL | Freq: Once | INTRAVENOUS | Status: AC | PRN
  Administered 2024-01-06: 7.5 mL via INTRAVENOUS

## 2024-01-09 ENCOUNTER — Other Ambulatory Visit: Payer: Self-pay

## 2024-01-09 DIAGNOSIS — Z006 Encounter for examination for normal comparison and control in clinical research program: Secondary | ICD-10-CM

## 2024-01-14 ENCOUNTER — Encounter (HOSPITAL_COMMUNITY): Payer: Self-pay

## 2024-01-14 ENCOUNTER — Other Ambulatory Visit (HOSPITAL_COMMUNITY)

## 2024-01-20 LAB — GENECONNECT MOLECULAR SCREEN: Genetic Analysis Overall Interpretation: NEGATIVE

## 2024-02-17 ENCOUNTER — Ambulatory Visit: Admitting: Podiatry

## 2024-02-17 DIAGNOSIS — M79675 Pain in left toe(s): Secondary | ICD-10-CM | POA: Diagnosis not present

## 2024-02-17 DIAGNOSIS — B351 Tinea unguium: Secondary | ICD-10-CM

## 2024-02-17 DIAGNOSIS — M79674 Pain in right toe(s): Secondary | ICD-10-CM | POA: Diagnosis not present

## 2024-02-17 NOTE — Progress Notes (Signed)
 Subjective:   Patient ID: Rollene Ace, female   DOB: 68 y.o.   MRN: [id number]   HPI Chief Complaint  Patient presents with   rfc    Patient presents today for routine foot care as well as callous shaving.    68 year old female presents the office today for concerns of pain to her toenails particularly left great toenail.  She is the nails get thick and long and causes discomfort.  No swelling, redness or drainage.  No open lesions.  Review of Systems  All other systems reviewed and are negative.    Objective:  Physical Exam  General: AAO x3, NAD  Dermatological: Incurvation present to the medial aspect of bilateral hallux toenail left side > right.  There is also again hyperkeratotic tissue noted adjacent to the toenail the distal portion of the nail which is sharply debrided with any complications or bleeding.  No drainage or pus. Nails are hypertrophic, dystrophic, brittle, discolored, elongated 10. No surrounding redness or drainage. Tenderness nails 1-5 bilaterally.    Vascular: Dorsalis Pedis artery and Posterior Tibial artery pedal pulses are 2/4 bilateral with immedate capillary fill time.  There is no pain with calf compression, swelling, warmth, erythema.   Neruologic: Grossly intact via light touch bilateral.   Musculoskeletal: Tenderness on the distal portion of the toenail on the left side.  There is no area pinpoint tenderness.  Gait: Unassisted, Nonantalgic.       Assessment:   68 year old female with symptomatic onychomycosis, hyperkeratotic lesion     Plan:  Symptomatic onychomycosis -Sharply debrided nails x 10 without any complications or bleeding.  Return in about 3 months (around 05/17/2024).  Donnice JONELLE Fees DPM

## 2024-05-18 ENCOUNTER — Ambulatory Visit: Admitting: Podiatry
